# Patient Record
Sex: Male | Born: 1950 | Race: White | Hispanic: No | Smoking: Former smoker
Health system: Southern US, Community
[De-identification: ages and names within clinical notes are randomized; demographics above are authoritative.]

## PROBLEM LIST (undated history)

## (undated) DIAGNOSIS — E039 Hypothyroidism, unspecified: Secondary | ICD-10-CM

## (undated) DIAGNOSIS — C439 Malignant melanoma of skin, unspecified: Secondary | ICD-10-CM

## (undated) DIAGNOSIS — R002 Palpitations: Secondary | ICD-10-CM

## (undated) DIAGNOSIS — R609 Edema, unspecified: Secondary | ICD-10-CM

## (undated) DIAGNOSIS — E785 Hyperlipidemia, unspecified: Secondary | ICD-10-CM

## (undated) DIAGNOSIS — M25569 Pain in unspecified knee: Secondary | ICD-10-CM

## (undated) DIAGNOSIS — M1990 Unspecified osteoarthritis, unspecified site: Secondary | ICD-10-CM

## (undated) DIAGNOSIS — N5082 Scrotal pain: Secondary | ICD-10-CM

## (undated) DIAGNOSIS — H9190 Unspecified hearing loss, unspecified ear: Secondary | ICD-10-CM

## (undated) DIAGNOSIS — N433 Hydrocele, unspecified: Secondary | ICD-10-CM

## (undated) HISTORY — PX: HERNIA REPAIR: SHX51

## (undated) HISTORY — DX: Malignant melanoma of skin, unspecified: C43.9

## (undated) HISTORY — PX: OTHER SURGICAL HISTORY: SHX169

## (undated) HISTORY — DX: Hyperlipidemia, unspecified: E78.5

## (undated) HISTORY — PX: JOINT REPLACEMENT: SHX530

## (undated) HISTORY — DX: Scrotal pain: N50.82

## (undated) HISTORY — DX: Hydrocele, unspecified: N43.3

## (undated) HISTORY — DX: Hypothyroidism, unspecified: E03.9

## (undated) HISTORY — DX: Pain in unspecified knee: M25.569

## (undated) HISTORY — DX: Unspecified osteoarthritis, unspecified site: M19.90

---

## 1995-03-21 HISTORY — PX: TOTAL KNEE ARTHROPLASTY: SHX125

## 2007-06-25 ENCOUNTER — Ambulatory Visit (HOSPITAL_COMMUNITY): Admission: RE | Admit: 2007-06-25 | Discharge: 2007-06-25 | Payer: Self-pay | Admitting: Family Medicine

## 2007-07-09 ENCOUNTER — Ambulatory Visit (HOSPITAL_COMMUNITY): Admission: RE | Admit: 2007-07-09 | Discharge: 2007-07-09 | Payer: Self-pay | Admitting: Internal Medicine

## 2012-03-20 HISTORY — PX: SKIN LESION EXCISION: SHX2412

## 2012-04-26 ENCOUNTER — Ambulatory Visit: Payer: Self-pay | Admitting: Surgery

## 2012-04-26 LAB — CBC WITH DIFFERENTIAL/PLATELET
Basophil #: 0.1 10*3/uL (ref 0.0–0.1)
Basophil %: 0.7 %
Eosinophil %: 1.2 %
Lymphocyte #: 1.7 10*3/uL (ref 1.0–3.6)
MCHC: 35.4 g/dL (ref 32.0–36.0)
Monocyte #: 0.7 x10 3/mm (ref 0.2–1.0)
Monocyte %: 9.2 %
Neutrophil #: 5.5 10*3/uL (ref 1.4–6.5)
Neutrophil %: 68.1 %
RBC: 5.08 10*6/uL (ref 4.40–5.90)

## 2012-04-26 LAB — COMPREHENSIVE METABOLIC PANEL
Albumin: 4.1 g/dL (ref 3.4–5.0)
Anion Gap: 4 — ABNORMAL LOW (ref 7–16)
Calcium, Total: 9.1 mg/dL (ref 8.5–10.1)
Chloride: 107 mmol/L (ref 98–107)
Co2: 30 mmol/L (ref 21–32)
Creatinine: 0.79 mg/dL (ref 0.60–1.30)
EGFR (Non-African Amer.): >60
Glucose: 90 mg/dL (ref 65–99)
Potassium: 3.9 mmol/L (ref 3.5–5.1)
SGOT(AST): 27 U/L (ref 15–37)
SGPT (ALT): 29 U/L (ref 12–78)

## 2012-04-30 ENCOUNTER — Ambulatory Visit: Payer: Self-pay | Admitting: Surgery

## 2012-05-06 LAB — PATHOLOGY REPORT

## 2012-06-12 ENCOUNTER — Ambulatory Visit: Payer: Self-pay | Admitting: Internal Medicine

## 2012-06-14 ENCOUNTER — Ambulatory Visit: Payer: Self-pay | Admitting: Internal Medicine

## 2012-06-18 ENCOUNTER — Ambulatory Visit: Payer: Self-pay | Admitting: Internal Medicine

## 2012-06-20 ENCOUNTER — Ambulatory Visit: Payer: Self-pay | Admitting: Internal Medicine

## 2013-12-17 IMAGING — PT NM PET TUM IMG INITIAL (PI) SKULL BASE T - THIGH
1 of 5 series · 1 of 25 positions shown · non-contrast
Comparison: none

REASON FOR EXAM: pulmonary nodule  melanoma mid back
COMMENTS:

[Series 3: ct legs 3.0 b30f · axial · 3.0mm · 0.98mm/px · 1 of 435 slices shown]
[im 218/435]
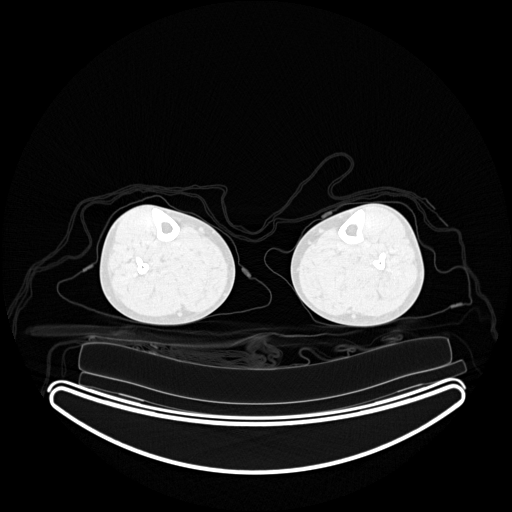

[1 of 25 positions shown; findings below may reference images not displayed]

PROCEDURE:     PET - PET/CT MELANOMA INITIAL STAGE WB  - June 20, 2012  [DATE]

RESULT:

Procedure:  Total body PET was performed in conjunction with nonattenuated
CT.

Radiopharmaceutical: 13.28 mCi of F18 labeled fluorodeoxyglucose

Injection site: Left hand via a 22-gauge needle

Injection time: [DATE] p.m.

Scan start time: [DATE] p.m.

Scan end time: [DATE] p.m.

Delayed imaging was obtained

Scan start time: [DATE] p.m.

Delay end time: [DATE] p.m

Fasting blood glucose is 115 mg/dl
FINDINGS: Expected bio-distribution is appreciated in the region of the brain, heart,
liver, spleen, kidneys, urinary bladder, and bowel.

Chest:  Mild to moderate focus of hypermetabolic activity projects within
the central portion of the mid back in a right paramedial location. There is
mild infiltration of the subcutaneous fat in this area. These findings are
concerning for the patient's region of melanoma. This area demonstrates an
SUV of 1.97.

No further foci of abnormal hypermetabolic activity are identified within
the chest. The nodule described on prior CT demonstrates no evidence of FDG
avidity.

Abdomen and pelvis: There is no evidence of abnormal foci of hypermetabolic
activity.

Bilateral lower extremities: Diffuse hypermetabolic activity is identified
within the musculature of the distal aspects of the right and left legs and
likely representing muscle activity. A hydrocele is appreciated on the right.
IMPRESSION: 1.  Mild to moderate hypermetabolic activity in a region of focal
subcutaneous fat infiltration. This finding is suspicious for the patient's
region of described melanoma or possibly post surgical bed.
2.  Findings consistent with muscle activity within the distal lower
extremities.
3.  No further regions of abnormal hypermetabolic activity appreciated.

## 2014-07-10 NOTE — Op Note (Signed)
PATIENT NAME:  Brian Beck, Brian Beck MR#:  [id number] DATE OF BIRTH:  26-Sep-1950  DATE OF PROCEDURE:  04/30/2012  PREOPERATIVE DIAGNOSIS: Malignant melanoma.   POSTOPERATIVE DIAGNOSIS: Malignant melanoma.   PROCEDURES PERFORMED: 1. Wide excision on right back.  2. Right axillary sentinel lymph node biopsy.   SURGEON: Rodena Goldmann, MD  ASSISTANT: [place], [place] Student  ANESTHESIA: General.   DESCRIPTION OF PROCEDURE: With the patient in the supine position after the induction of appropriate general anesthesia, the patient was placed in the prone position, appropriately padded and positioned. A 1 cm completely elliptical incision was made around the previous lesion using measuring device and markers. The area was removed down to the fascia without difficulty. The specimen was marked and sent to pathology for permanent section. The subcutaneous flaps were mobilized and closed in 2 layers using 3-0 Vicryl as a subcutaneous suture and 3-0 nylon as a skin suture. Sterile dressings were applied. The patient was then returned to the prone position with his arm appropriately padded and positioned. The axillary area was prepped and draped and the area interrogated with the Neoprobe. An incision was made transversely and carried down through the subcutaneous tissue with Bovie electrocautery. Careful dissection of the axilla identified 1 large lymph node which had 300 times the counts of the surrounding tissue. It was removed without difficulty and sent for permanent sections. The remainder of the axilla was examined and no other areas were identified as carrying any radioactivity. Another large lymph node was identified and removed for a second specimen. The area was then irrigated. A drain was placed because of the wide dissection using a 7 mm flat Jackson-Pratt drain. It was secured with 3-0 nylon and the skin was closed with 4-0 nylon. Sterile dressings were applied. The      patient was returned to the recovery room  having tolerated the procedure well. Sponge, instrument and needle counts were correct x 2, in the operating room.  ____________________________ Rodena Goldmann III, MD rle:sb D: 04/30/2012 14:05:09 ET T: 04/30/2012 14:18:37 ET JOB#: [id number]  cc: Rodena Goldmann III, MD, <Dictator> Rodena Goldmann MD ELECTRONICALLY SIGNED 05/01/2012 18:05

## 2014-11-13 ENCOUNTER — Other Ambulatory Visit: Payer: Self-pay | Admitting: Family Medicine

## 2014-11-17 ENCOUNTER — Telehealth: Payer: Self-pay | Admitting: *Deleted

## 2014-11-17 ENCOUNTER — Encounter: Payer: Self-pay | Admitting: *Deleted

## 2014-11-17 NOTE — Telephone Encounter (Signed)
4 attempts made to reach pt; with busy/invalid signal each time.Brian KitchenMarland KitchenWill attempt to contact via letter.Brian KitchenMarland Beck

## 2015-01-27 ENCOUNTER — Encounter: Payer: Self-pay | Admitting: Urology

## 2015-01-28 ENCOUNTER — Ambulatory Visit (INDEPENDENT_AMBULATORY_CARE_PROVIDER_SITE_OTHER): Payer: BLUE CROSS/BLUE SHIELD | Admitting: Urology

## 2015-01-28 ENCOUNTER — Encounter: Payer: Self-pay | Admitting: Urology

## 2015-01-28 VITALS — BP 144/79 | HR 89 | Ht 64.0 in | Wt 195.4 lb

## 2015-01-28 DIAGNOSIS — N433 Hydrocele, unspecified: Secondary | ICD-10-CM

## 2015-01-28 DIAGNOSIS — K4021 Bilateral inguinal hernia, without obstruction or gangrene, recurrent: Secondary | ICD-10-CM

## 2015-01-28 NOTE — Progress Notes (Signed)
01/28/2015 3:18 PM   Brian Beck 10-Jan-1951 [id number]  Referring provider: No referring provider defined for this encounter.  Chief Complaint  Patient presents with  . Hydrocele    HPI: 64 year old male with known right hydrocele, bilateral inguinal hernia who presents today for reassessment. He was last seen in our office in December 2015 at which time he was diagnosed with a large right hydrocele. He also has a known history of bilateral asymptomatic inguinal hernias but elected not to have these treated at the time. He did have a scrotal ultrasound on 03/03/2014 which showed normal testicles bilaterally, large right hydrocele, and bilateral inguinal fat-containing hernias.  He returns today as his right hemiscrotum continues to slowly enlarge.  It has become more symptomatic and bothersome and he now with like to proceed with surgical intervention for this.  He does complain of discomfort in the groin area, heaviness, and difficulty fitting his pants in the crotch area.  Denies any bowel symptoms associated with his inguinal hernias. No urinary issues including no dysuria, hematuria, infections, or urgency/ frequency.     PMH: Past Medical History  Diagnosis Date  . Melanoma of skin ([place])   . Arthritis   . Pain in scrotum   . Pain in knee   . Hypothyroidism   . Hypertension   . Hyperlipemia   . Hydrocele, right     Surgical History: Past Surgical History  Procedure Laterality Date  . Total knee arthroplasty Right 1997  . Skin lesion excision  2014    Home Medications:    Medication List       This list is accurate as of: 01/28/15  3:18 PM.  Always use your most recent med list.               hydrochlorothiazide 25 MG tablet  Commonly known as:  HYDRODIURIL        Allergies:  Allergies  Allergen Reactions  . Penicillins Swelling    Family History: Family History  Problem Relation Age of Onset  . Heart disease Father   . Kidney disease Mother    . Prostate cancer Neg Hx   . Bladder Cancer Neg Hx     Social History:  reports that he has been smoking Cigarettes.  He has been smoking about 0.75 packs per day. His smokeless tobacco use includes Snuff. He reports that he drinks alcohol. He reports that he does not use illicit drugs.  ROS: UROLOGY Frequent Urination?: No Hard to postpone urination?: No Burning/pain with urination?: No Get up at night to urinate?: No Leakage of urine?: No Urine stream starts and stops?: No Trouble starting stream?: No Do you have to strain to urinate?: No Blood in urine?: No Urinary tract infection?: No Sexually transmitted disease?: No Injury to kidneys or bladder?: No Painful intercourse?: No Weak stream?: Yes Erection problems?: No Penile pain?: No  Gastrointestinal Nausea?: No Vomiting?: No Indigestion/heartburn?: No Diarrhea?: No Constipation?: No  Constitutional Fever: No Night sweats?: No Weight loss?: No Fatigue?: No  Skin Skin rash/lesions?: No Itching?: No  Eyes Blurred vision?: No Double vision?: No  Ears/Nose/Throat Sore throat?: No Sinus problems?: No  Hematologic/Lymphatic Swollen glands?: No Easy bruising?: No  Cardiovascular Leg swelling?: No Chest pain?: No  Respiratory Cough?: No Shortness of breath?: No  Endocrine Excessive thirst?: No  Musculoskeletal Back pain?: No Joint pain?: Yes  Neurological Headaches?: No Dizziness?: No  Psychologic Depression?: No Anxiety?: No  Physical Exam: BP 144/79 mmHg  Pulse 89  Ht 5\' 4"  (1.626 m)  Wt 195 lb 6.4 oz (88.633 kg)  BMI 33.52 kg/m2  Constitutional:  Alert and oriented, No acute distress. HEENT: [place] AT, moist mucus membranes.  Trachea midline, no masses. Cardiovascular: No clubbing, cyanosis, or edema.   RRR. Respiratory: Normal respiratory effort, no increased work of breathing.  CTAB. GI: Abdomen is soft, nontender, nondistended, no abdominal masses GU: No CVA tenderness. Normal  phallus. Enlarged fluctuant right hemiscrotum, approximately small honeydew melon sized. Bilateral inguinal hernias not obviously appreciated but exam limited by habitus.  Skin: No rashes, bruises or suspicious lesions. Lymph: No cervical or inguinal adenopathy. Neurologic: Grossly intact, no focal deficits, moving all 4 extremities. Psychiatric: Normal mood and affect.  Laboratory Data: Lab Results  Component Value Date   WBC 8.0 04/26/2012   HGB 16.3 04/26/2012   HCT 45.9 04/26/2012   MCV 90 04/26/2012   PLT 253 04/26/2012    Lab Results  Component Value Date   CREATININE 0.79 04/26/2012     Pertinent Imaging: Scrotal US 03/02/14 EXAM: US SCROTUM DATE: 03/02/14 12:09:26 ACCESSION: [id number] UN DICTATED: 03/02/14 14:22:17 INTERPRETATION LOCATION: [place]  CLINICAL INDICATION: 64 Year Old (M): Right scrotal enlargement  COMPARISON: None  TECHNIQUE:  Ultrasound views of the scrotum were obtained using gray scale and color and spectral Doppler imaging.  FINDINGS: The testes measured 4.8 x 3.2 x 3.6 cm on the right and 4.7 x 2.5 x 3.2 cm on the left.  Appropriate internal flow was demonstrated with color Doppler.  The echogenicity of the testicles was within normal limits. No focal testicular masses were seen.  The right epididymal head measured 11.9 mm in size with the left 8.5 mm in size.  Appropriate flow was seen.  A large hydrocele was noted on the right that contained simple fluid. No hydrocele was seen on the left.  A small amount of fat is seen extending into the proximal inguinal canals bilaterally with Valsalva.   IMPRESSION: -- Large right hydrocele. -- Small, fat-containing inguinal hernias bilaterally.   Assessment & Plan:    1. Hydrocele, right Discussed various options for treatment of his right hydrocele including right hydrocelectomy versus percutaneous drainage. Given the presence of an inguinal hernia on the right in addition to the hydrocele,  would recommend concomitant surgery with repair of the hydrocele and hernia in order to further reduce recurrence in the future. We discussed the overall efficacy rate as well as the risks of surgery including risk of bleeding, hematoma, infection, damage to surrounding structures, testicular loss, recurrence rates, as well as the common postoperative course. All of his questions were answered in detail today.  Plan refer the patient to Gen. surgery to assess bilateral inguinal hernias and will attempt to coordinate surgery in the near future.  - Ambulatory referral to General Surgery   2. Bilateral recurrent inguinal hernia without obstruction or gangrene As above - Ambulatory referral to General Surgery    Return schedule surgery.   Hollice Espy, MD  [facility] [address] [place], [place] [address] [phone]

## 2015-02-26 ENCOUNTER — Encounter: Payer: Self-pay | Admitting: General Surgery

## 2015-02-26 ENCOUNTER — Ambulatory Visit (INDEPENDENT_AMBULATORY_CARE_PROVIDER_SITE_OTHER): Payer: BLUE CROSS/BLUE SHIELD | Admitting: General Surgery

## 2015-02-26 ENCOUNTER — Encounter (INDEPENDENT_AMBULATORY_CARE_PROVIDER_SITE_OTHER): Payer: Self-pay

## 2015-02-26 VITALS — BP 149/83 | HR 76 | Temp 98.4°F | Ht 65.0 in | Wt 197.0 lb

## 2015-02-26 DIAGNOSIS — K4020 Bilateral inguinal hernia, without obstruction or gangrene, not specified as recurrent: Secondary | ICD-10-CM | POA: Insufficient documentation

## 2015-02-26 NOTE — Progress Notes (Signed)
Patient ID: Brian Beck, male   DOB: 1950-04-29, 64 y.o.   MRN: [id number]  CC: Hernia  HPI Brian Beck is a 64 y.o. male sent here for evaluation of bilateral inguinal hernias. He is also had a urologic evaluation for a large right-sided hydrocele. Urology since him for evaluation for a concomitant hydrocele repair and hernia repair. Patient states that he is known about his bilateral anal hernias for a long time however only his right side of her bothersome. He occasionally has pain up into his right abdomen down his right leg associated with activity such as lifting. It always goes away with rest and lying back. Difficult to ascertain the difference between bulge and groin and hydrocele. Both are large. He is otherwise in his usual state of health. He denies any fevers, chills, nausea, vomiting, diarrhea, constipation. He does have an allergy to penicillins.  HPI  Past Medical History  Diagnosis Date  . Melanoma of skin ([place])   . Arthritis   . Pain in scrotum   . Pain in knee   . Hypothyroidism   . Hypertension   . Hyperlipemia   . Hydrocele, right     Past Surgical History  Procedure Laterality Date  . Total knee arthroplasty Right 1997  . Skin lesion excision  2014    Family History  Problem Relation Age of Onset  . Heart disease Father   . Kidney disease Mother   . Prostate cancer Neg Hx   . Bladder Cancer Neg Hx     Social History Social History  Substance Use Topics  . Smoking status: Current Every Day Smoker -- 0.75 packs/day    Types: Cigarettes  . Smokeless tobacco: Current User    Types: Snuff  . Alcohol Use: 0.0 oz/week    0 Standard drinks or equivalent per week    Allergies  Allergen Reactions  . Penicillins Swelling    Current Outpatient Prescriptions  Medication Sig Dispense Refill  . Cyanocobalamin (VITAMIN B-12 IJ) Inject 1,000 mcg as directed once a week.    . hydrochlorothiazide (HYDRODIURIL) 25 MG tablet   0  .  multivitamin-iron-minerals-folic acid (CENTRUM) chewable tablet Chew 1 tablet by mouth daily.    . Omega-3 Fatty Acids (FISH OIL) 1000 MG CAPS Take 1 capsule by mouth 1 day or 1 dose.     No current facility-administered medications for this visit.     Review of Systems A multi-point review of systems was asked and was negative except for the findings documented in the history of present illness  Physical Exam Blood pressure 149/83, pulse 76, temperature 98.4 F (36.9 C), temperature source Oral, height 5\' 5"  (1.651 m), weight 89.359 kg (197 lb). CONSTITUTIONAL: No acute distress. EYES: Pupils are equal, round, and reactive to light, Sclera are non-icteric. EARS, NOSE, MOUTH AND THROAT: The oropharynx is clear. The oral mucosa is pink and moist. Hearing is intact to voice. LYMPH NODES:  Lymph nodes in the neck are normal. RESPIRATORY:  Lungs are clear. There is normal respiratory effort, with equal breath sounds bilaterally, and without pathologic use of accessory muscles. CARDIOVASCULAR: Heart is regular without murmurs, gallops, or rubs. GI: The abdomen is large, soft, nontender, and nondistended. There are no palpable masses. There is no hepatosplenomegaly. There are normal bowel sounds in all quadrants. GU: Very large right-sided hydrocele appreciated. Otherwise normal-appearing left hemiscrotum and penis. Easily palpable and reducible right inguinal hernia. Nontender on exam. Smaller but palpable and reducible left anal hernia. Also  nontender. Patient does have abdominal girth that overhangs like a pannus..   MUSCULOSKELETAL: Normal muscle strength and tone. No cyanosis or edema.   SKIN: Turgor is good and there are no pathologic skin lesions or ulcers. NEUROLOGIC: Motor and sensation is grossly normal. Cranial nerves are grossly intact. PSYCH:  Oriented to person, place and time. Affect is normal.  Data Reviewed No new labs her images. I have personally reviewed the patient's  imaging, laboratory findings and medical records.    Assessment    Right-sided hydrocele and bilateral inguinal hernias.    Plan    Discussed with patient that it would be possible for concomitant surgery for his right-sided hydrocele and inguinal hernia repair. Discussed that given his left side is primarily asymptomatic in that his hydrocele surgery will be on the right that we would plan to do an open right inguinal hernia. Same time as hydrocele repair. Patient voiced understanding and is appreciative of the ability to do concomitant surgeries. Discussed with urology with a plan for surgery on December 27.  I have explained the procedure, risks, and aftercare of inguinal hernia repair to [employer].   Risks include but are not limited to bleeding, infection, wound problems, anesthesia, recurrence, bladder or intestine injury, urinary retention, testicular dysfunction, chronic pain, mesh problems.  He  seems to understand and agrees to proceed.  Questions were answered to his stated satisfaction.      Time spent with the patient was 45 minutes, with more than 50% of the time spent in face-to-face education, counseling and care coordination.     Clayburn Pert, MD FACS General Surgeon 02/26/2015, 2:30 PM

## 2015-02-26 NOTE — Patient Instructions (Signed)
As soon as we get your surgery arranged with Dr. Erlene Quan and we will call you with the details of this.

## 2015-03-01 ENCOUNTER — Telehealth: Payer: Self-pay | Admitting: General Surgery

## 2015-03-01 NOTE — Telephone Encounter (Signed)
I have called Pt to advised him of pre op date/time and sx date. No answer. I have left a detailed message with information below.  Sx: 03/16/15 with Dr Golden Circle Right inguinal hernia repair with Dr Erlene Quan (Urology) performing Hydrocele repair.  Pre op: 03/04/15 @ 1:00pm. Office.

## 2015-03-03 NOTE — Telephone Encounter (Signed)
Patient has called back and was informed of surgery appointment. Patient agrees.

## 2015-03-04 ENCOUNTER — Ambulatory Visit
Admission: RE | Admit: 2015-03-04 | Discharge: 2015-03-04 | Disposition: A | Discharge status: Home / Self Care | Payer: BLUE CROSS/BLUE SHIELD | Source: Ambulatory Visit | Attending: General Surgery | Admitting: General Surgery

## 2015-03-04 ENCOUNTER — Encounter
Admission: RE | Admit: 2015-03-04 | Discharge: 2015-03-04 | Disposition: A | Discharge status: Home / Self Care | Payer: BLUE CROSS/BLUE SHIELD | Source: Ambulatory Visit | Attending: General Surgery | Admitting: General Surgery

## 2015-03-04 ENCOUNTER — Other Ambulatory Visit: Payer: Self-pay

## 2015-03-04 DIAGNOSIS — I700 Atherosclerosis of aorta: Secondary | ICD-10-CM | POA: Insufficient documentation

## 2015-03-04 DIAGNOSIS — Z01818 Encounter for other preprocedural examination: Secondary | ICD-10-CM | POA: Insufficient documentation

## 2015-03-04 DIAGNOSIS — Z01810 Encounter for preprocedural cardiovascular examination: Secondary | ICD-10-CM

## 2015-03-04 LAB — COMPREHENSIVE METABOLIC PANEL
ALBUMIN: 4 g/dL (ref 3.5–5.0)
ALK PHOS: 47 U/L (ref 38–126)
ALT: 20 U/L (ref 17–63)
AST: 17 U/L (ref 15–41)
Anion gap: 9 (ref 5–15)
BILIRUBIN TOTAL: 0.8 mg/dL (ref 0.3–1.2)
BUN: 18 mg/dL (ref 6–20)
CALCIUM: 9.1 mg/dL (ref 8.9–10.3)
CO2: 29 mmol/L (ref 22–32)
CREATININE: 0.78 mg/dL (ref 0.61–1.24)
Chloride: 104 mmol/L (ref 101–111)
GFR calc Af Amer: >60 mL/min (ref >60)
GLUCOSE: 94 mg/dL (ref 65–99)
Potassium: 3.4 mmol/L — ABNORMAL LOW (ref 3.5–5.1)
SODIUM: 142 mmol/L (ref 135–145)
TOTAL PROTEIN: 6.7 g/dL (ref 6.5–8.1)

## 2015-03-04 LAB — CBC WITH DIFFERENTIAL/PLATELET
BASOS ABS: 0 10*3/uL (ref 0–0.1)
BASOS PCT: 1 %
EOS ABS: 0.1 10*3/uL (ref 0–0.7)
EOS PCT: 2 %
HCT: 42.9 % (ref 40.0–52.0)
Hemoglobin: 15 g/dL (ref 13.0–18.0)
LYMPHS PCT: 16 %
Lymphs Abs: 1.3 10*3/uL (ref 1.0–3.6)
MCH: 31.3 pg (ref 26.0–34.0)
MCHC: 35 g/dL (ref 32.0–36.0)
MCV: 89.4 fL (ref 80.0–100.0)
Monocytes Absolute: 0.7 10*3/uL (ref 0.2–1.0)
Monocytes Relative: 9 %
Neutro Abs: 6 10*3/uL (ref 1.4–6.5)
Neutrophils Relative %: 74 %
PLATELETS: 230 10*3/uL (ref 150–440)
RBC: 4.8 MIL/uL (ref 4.40–5.90)
RDW: 13.6 % (ref 11.5–14.5)
WBC: 8.2 10*3/uL (ref 3.8–10.6)

## 2015-03-04 LAB — PROTIME-INR
INR: 0.96
Prothrombin Time: 13 seconds (ref 11.4–15.0)

## 2015-03-04 LAB — APTT: APTT: 46 s — AB (ref 24–36)

## 2015-03-04 NOTE — OR Nursing (Signed)
Notified Angie at [facility] Surgical patient has abnormal ECG and will need clearance

## 2015-03-04 NOTE — Patient Instructions (Signed)
  Your procedure is scheduled on: Tuesday 03/16/2015 Report to Day Surgery. 2ND FLOOR MEDICAL MALL ENTRANCE To find out your arrival time please call [phone] between 1PM - 3PM on Friday 03/12/2015.  Remember: Instructions that are not followed completely may result in serious medical risk, up to and including death, or upon the discretion of your surgeon and anesthesiologist your surgery may need to be rescheduled.    __X__ 1. Do not eat food or drink liquids after midnight. No gum chewing or hard candies.     __X__ 2. No Alcohol for 24 hours before or after surgery.   ____ 3. Bring all medications with you on the day of surgery if instructed.    __X__ 4. Notify your doctor if there is any change in your medical condition     (cold, fever, infections).     Do not wear jewelry, make-up, hairpins, clips or nail polish.  Do not wear lotions, powders, or perfumes. You may wear deodorant.  Do not shave 48 hours prior to surgery. Men may shave face and neck.  Do not bring valuables to the hospital.    [facility] is not responsible for any belongings or valuables.               Contacts, dentures or bridgework may not be worn into surgery.  Leave your suitcase in the car. After surgery it may be brought to your room.  For patients admitted to the hospital, discharge time is determined by your                treatment team.   Patients discharged the day of surgery will not be allowed to drive home.   Please read over the following fact sheets that you were given:   Surgical Site Infection Prevention   ___ Take these medicines the morning of surgery with A SIP OF WATER:    1. NONE  2.   3.   4.  5.  6.  ____ Fleet Enema (as directed)   __X__ Use CHG Soap as directed  ____ Use inhalers on the day of surgery  ____ Stop metformin 2 days prior to surgery    ____ Take 1/2 of usual insulin dose the night before surgery and none on the morning of surgery.   ____ Stop  Coumadin/Plavix/aspirin on   ____ Stop Anti-inflammatories on    __X__ Stop supplements until after surgery.  FISH OIL, VITAMIN C, VITAMIN B12  ____ Bring C-Pap to the hospital.

## 2015-03-05 NOTE — OR Nursing (Signed)
Abnormal PTT value results faxed to Dr. Adonis Huguenin and Dr. Cherrie Gauze offices.

## 2015-03-08 ENCOUNTER — Telehealth: Payer: Self-pay | Admitting: General Surgery

## 2015-03-08 NOTE — Telephone Encounter (Signed)
Amy from Urology called and stated that they received lab results on the patient with an elevated aPTT. Please review pre op labs and if needed, contact patient with APPROPRIATE  ANTICOAGULANT THERAPY.

## 2015-03-08 NOTE — Telephone Encounter (Signed)
I have reviewed PTT results.  Patient is not currently taking any anticoagulant therapy. Verified this information with patient. Dr. Adonis Huguenin received same results. No new orders at this time.

## 2015-03-11 NOTE — Pre-Procedure Instructions (Signed)
Left message at Dr Rosario Jacks office re status of clearance. Was for stress test 03/09/15

## 2015-03-12 ENCOUNTER — Telehealth: Payer: Self-pay

## 2015-03-12 NOTE — Telephone Encounter (Signed)
Received call from [facility] in Pre-admit to ask about status of clearance for scheduled 12/27 surgery for which I was unaware of. Upon looking at notes in chart, Dr. Rosario Jacks was to see patient for clearance and stress test on 12/20.   Spoke with staff at Dr. Guerry Bruin office at this time in regards to patient's surgical clearance, she will fax over notes from 12/20 visit at this time.

## 2015-03-12 NOTE — Telephone Encounter (Signed)
Clearance obtained from Dr. Rosario Jacks, placed in chart under media.  Call to PAT at this time, spoke with [facility]. She will leave a message for Baker Janus to let her know this information.

## 2015-03-16 ENCOUNTER — Encounter
Admission: RE | Disposition: A | Discharge status: Home / Self Care | Payer: Self-pay | Source: Ambulatory Visit | Attending: Urology

## 2015-03-16 ENCOUNTER — Ambulatory Visit
Admission: RE | Admit: 2015-03-16 | Discharge: 2015-03-16 | Disposition: A | Discharge status: Home / Self Care | Payer: BLUE CROSS/BLUE SHIELD | Source: Ambulatory Visit | Attending: Urology | Admitting: Urology

## 2015-03-16 ENCOUNTER — Ambulatory Visit: Payer: BLUE CROSS/BLUE SHIELD | Admitting: Anesthesiology

## 2015-03-16 DIAGNOSIS — E039 Hypothyroidism, unspecified: Secondary | ICD-10-CM | POA: Diagnosis not present

## 2015-03-16 DIAGNOSIS — N433 Hydrocele, unspecified: Secondary | ICD-10-CM | POA: Insufficient documentation

## 2015-03-16 DIAGNOSIS — Z880 Allergy status to penicillin: Secondary | ICD-10-CM | POA: Insufficient documentation

## 2015-03-16 DIAGNOSIS — Z79899 Other long term (current) drug therapy: Secondary | ICD-10-CM | POA: Insufficient documentation

## 2015-03-16 DIAGNOSIS — K4020 Bilateral inguinal hernia, without obstruction or gangrene, not specified as recurrent: Secondary | ICD-10-CM | POA: Diagnosis not present

## 2015-03-16 DIAGNOSIS — Z96651 Presence of right artificial knee joint: Secondary | ICD-10-CM | POA: Diagnosis not present

## 2015-03-16 DIAGNOSIS — Z841 Family history of disorders of kidney and ureter: Secondary | ICD-10-CM | POA: Diagnosis not present

## 2015-03-16 DIAGNOSIS — K4090 Unilateral inguinal hernia, without obstruction or gangrene, not specified as recurrent: Secondary | ICD-10-CM | POA: Diagnosis present

## 2015-03-16 DIAGNOSIS — E785 Hyperlipidemia, unspecified: Secondary | ICD-10-CM | POA: Insufficient documentation

## 2015-03-16 DIAGNOSIS — Z85820 Personal history of malignant melanoma of skin: Secondary | ICD-10-CM | POA: Diagnosis not present

## 2015-03-16 DIAGNOSIS — M1990 Unspecified osteoarthritis, unspecified site: Secondary | ICD-10-CM | POA: Diagnosis not present

## 2015-03-16 DIAGNOSIS — K469 Unspecified abdominal hernia without obstruction or gangrene: Secondary | ICD-10-CM | POA: Insufficient documentation

## 2015-03-16 DIAGNOSIS — I10 Essential (primary) hypertension: Secondary | ICD-10-CM | POA: Diagnosis not present

## 2015-03-16 DIAGNOSIS — Z8249 Family history of ischemic heart disease and other diseases of the circulatory system: Secondary | ICD-10-CM | POA: Diagnosis not present

## 2015-03-16 DIAGNOSIS — F17210 Nicotine dependence, cigarettes, uncomplicated: Secondary | ICD-10-CM | POA: Diagnosis not present

## 2015-03-16 HISTORY — PX: INGUINAL HERNIA REPAIR: SHX194

## 2015-03-16 HISTORY — PX: HYDROCELE EXCISION: SHX482

## 2015-03-16 SURGERY — HYDROCELECTOMY
Anesthesia: General | Laterality: Right | Wound class: Clean Contaminated

## 2015-03-16 MED ORDER — MIDAZOLAM HCL 2 MG/2ML IJ SOLN
INTRAMUSCULAR | Status: DC | PRN
Start: 2015-03-16 — End: 2015-03-16
  Administered 2015-03-16: 2 mg via INTRAVENOUS

## 2015-03-16 MED ORDER — ONDANSETRON HCL 4 MG/2ML IJ SOLN
INTRAMUSCULAR | Status: DC | PRN
  Administered 2015-03-16: 4 mg via INTRAVENOUS

## 2015-03-16 MED ORDER — CLINDAMYCIN PHOSPHATE 900 MG/50ML IV SOLN
INTRAVENOUS | Status: AC
  Filled 2015-03-16: qty 50

## 2015-03-16 MED ORDER — FAMOTIDINE 20 MG PO TABS
20.0000 mg | ORAL_TABLET | Freq: Once | ORAL | Status: AC
  Administered 2015-03-16: 20 mg via ORAL

## 2015-03-16 MED ORDER — LIDOCAINE HCL 1 % IJ SOLN
INTRAMUSCULAR | Status: DC | PRN
  Administered 2015-03-16: 10 mL

## 2015-03-16 MED ORDER — FAMOTIDINE 20 MG PO TABS
ORAL_TABLET | ORAL | Status: AC
  Administered 2015-03-16: 20 mg via ORAL
  Filled 2015-03-16: qty 1

## 2015-03-16 MED ORDER — CHLORHEXIDINE GLUCONATE 4 % EX LIQD: 1.0000 | Freq: Once | CUTANEOUS | Status: DC

## 2015-03-16 MED ORDER — DOCUSATE SODIUM 100 MG PO CAPS: 100.0000 mg | ORAL_CAPSULE | Freq: Two times a day (BID) | ORAL | Status: AC

## 2015-03-16 MED ORDER — ONDANSETRON HCL 4 MG/2ML IJ SOLN: 4.0000 mg | Freq: Once | INTRAMUSCULAR | Status: DC | PRN

## 2015-03-16 MED ORDER — SODIUM CHLORIDE 0.9 % IJ SOLN
INTRAMUSCULAR | Status: AC
  Filled 2015-03-16: qty 50

## 2015-03-16 MED ORDER — GLYCOPYRROLATE 0.2 MG/ML IJ SOLN
INTRAMUSCULAR | Status: DC | PRN
  Administered 2015-03-16: 0.4 mg via INTRAVENOUS

## 2015-03-16 MED ORDER — CLINDAMYCIN PHOSPHATE 900 MG/50ML IV SOLN
900.0000 mg | INTRAVENOUS | Status: AC
  Administered 2015-03-16: 900 mg via INTRAVENOUS

## 2015-03-16 MED ORDER — ACETAMINOPHEN 10 MG/ML IV SOLN
INTRAVENOUS | Status: AC
  Filled 2015-03-16: qty 100

## 2015-03-16 MED ORDER — BUPIVACAINE LIPOSOME 1.3 % IJ SUSP
INTRAMUSCULAR | Status: AC
  Filled 2015-03-16: qty 20

## 2015-03-16 MED ORDER — BUPIVACAINE HCL (PF) 0.5 % IJ SOLN
INTRAMUSCULAR | Status: AC
  Filled 2015-03-16: qty 30

## 2015-03-16 MED ORDER — ROCURONIUM BROMIDE 100 MG/10ML IV SOLN
INTRAVENOUS | Status: DC | PRN
  Administered 2015-03-16: 40 mg via INTRAVENOUS

## 2015-03-16 MED ORDER — FENTANYL CITRATE (PF) 100 MCG/2ML IJ SOLN: 25.0000 ug | INTRAMUSCULAR | Status: DC | PRN

## 2015-03-16 MED ORDER — SODIUM CHLORIDE 0.9 % IV SOLN
INTRAVENOUS | Status: DC | PRN
  Administered 2015-03-16: 40 mL

## 2015-03-16 MED ORDER — LIDOCAINE HCL (PF) 1 % IJ SOLN
INTRAMUSCULAR | Status: AC
  Filled 2015-03-16: qty 30

## 2015-03-16 MED ORDER — PROPOFOL 10 MG/ML IV BOLUS
INTRAVENOUS | Status: DC | PRN
  Administered 2015-03-16: 180 mg via INTRAVENOUS

## 2015-03-16 MED ORDER — LACTATED RINGERS IV SOLN
INTRAVENOUS | Status: DC
  Administered 2015-03-16: 12:00:00 via INTRAVENOUS

## 2015-03-16 MED ORDER — OXYCODONE-ACETAMINOPHEN 5-325 MG PO TABS
1.0000 | ORAL_TABLET | ORAL | Status: DC | PRN
  Administered 2015-03-16: 1 via ORAL

## 2015-03-16 MED ORDER — OXYCODONE-ACETAMINOPHEN 5-325 MG PO TABS: 1.0000 | ORAL_TABLET | ORAL | Status: DC | PRN

## 2015-03-16 MED ORDER — FENTANYL CITRATE (PF) 100 MCG/2ML IJ SOLN
INTRAMUSCULAR | Status: DC | PRN
  Administered 2015-03-16: 50 ug via INTRAVENOUS
  Administered 2015-03-16: 100 ug via INTRAVENOUS
  Administered 2015-03-16: 50 ug via INTRAVENOUS

## 2015-03-16 MED ORDER — ONDANSETRON 4 MG PO TBDP: 4.0000 mg | ORAL_TABLET | Freq: Three times a day (TID) | ORAL | Status: DC | PRN

## 2015-03-16 MED ORDER — ACETAMINOPHEN 10 MG/ML IV SOLN
INTRAVENOUS | Status: DC | PRN
  Administered 2015-03-16: 1000 mg via INTRAVENOUS

## 2015-03-16 MED ORDER — NEOSTIGMINE METHYLSULFATE 10 MG/10ML IV SOLN
INTRAVENOUS | Status: DC | PRN
  Administered 2015-03-16: 3 mg via INTRAVENOUS

## 2015-03-16 MED ORDER — LIDOCAINE HCL (CARDIAC) 20 MG/ML IV SOLN
INTRAVENOUS | Status: DC | PRN
  Administered 2015-03-16: 100 mg via INTRAVENOUS
  Administered 2015-03-16: 40 mg via INTRAVENOUS

## 2015-03-16 MED ORDER — OXYCODONE-ACETAMINOPHEN 5-325 MG PO TABS
ORAL_TABLET | ORAL | Status: AC
  Filled 2015-03-16: qty 1

## 2015-03-16 SURGICAL SUPPLY — 48 items
BLADE SURG 15 STRL LF DISP TIS (BLADE) ×2 IMPLANT
BLADE SURG 15 STRL SS (BLADE) ×4
CANISTER SUCT 1200ML W/VALVE (MISCELLANEOUS) ×3 IMPLANT
CHLORAPREP W/TINT 26ML (MISCELLANEOUS) ×3 IMPLANT
DRAIN PENROSE 1/4X12 LTX (DRAIN) ×3 IMPLANT
DRAIN PENROSE 5/8X18 LTX STRL (WOUND CARE) ×3 IMPLANT
DRAPE LAPAROTOMY 100X77 ABD (DRAPES) ×3 IMPLANT
DRAPE LAPAROTOMY 77X122 PED (DRAPES) ×3 IMPLANT
DRAPE SHEET LG 3/4 BI-LAMINATE (DRAPES) IMPLANT
ELECT CAUTERY BLADE 6.4 (BLADE) ×3 IMPLANT
GAUZE FLUFF 18X24 1PLY STRL (GAUZE/BANDAGES/DRESSINGS) ×3 IMPLANT
GAUZE SPONGE 4X4 12PLY STRL (GAUZE/BANDAGES/DRESSINGS) ×3 IMPLANT
GAUZE STRETCH 2X75IN STRL (MISCELLANEOUS) ×3 IMPLANT
GLOVE BIO SURGEON STRL SZ 6.5 (GLOVE) ×4 IMPLANT
GLOVE BIO SURGEON STRL SZ7 (GLOVE) ×6 IMPLANT
GLOVE BIO SURGEON STRL SZ7.5 (GLOVE) ×3 IMPLANT
GLOVE BIO SURGEONS STRL SZ 6.5 (GLOVE) ×2
GLOVE INDICATOR 8.0 STRL GRN (GLOVE) ×3 IMPLANT
GOWN STRL REUS W/ TWL LRG LVL3 (GOWN DISPOSABLE) ×3 IMPLANT
GOWN STRL REUS W/TWL LRG LVL3 (GOWN DISPOSABLE) ×6
KIT RM TURNOVER STRD PROC AR (KITS) ×3 IMPLANT
LABEL OR SOLS (LABEL) ×6 IMPLANT
LIQUID BAND (GAUZE/BANDAGES/DRESSINGS) ×3 IMPLANT
MESH PARIETEX PROGRIP RIGHT (Mesh General) ×3 IMPLANT
NDL SAFETY 25GX1.5 (NEEDLE) ×3 IMPLANT
NEEDLE HYPO 25X1 1.5 SAFETY (NEEDLE) ×3 IMPLANT
NS IRRIG 500ML POUR BTL (IV SOLUTION) ×3 IMPLANT
PACK BASIN MINOR ARMC (MISCELLANEOUS) ×6 IMPLANT
PAD GROUND ADULT SPLIT (MISCELLANEOUS) ×3 IMPLANT
PREP PVP WINGED SPONGE (MISCELLANEOUS) IMPLANT
SOL PREP PVP 2OZ (MISCELLANEOUS)
SOLUTION PREP PVP 2OZ (MISCELLANEOUS) IMPLANT
SUPPORETR ATHLETIC LG (MISCELLANEOUS) ×1 IMPLANT
SUPPORTER ATHLETIC LG (MISCELLANEOUS) ×3
SUT ETHIBOND 0 MO6 C/R (SUTURE) IMPLANT
SUT ETHILON 3-0 FS-10 30 BLK (SUTURE) ×3
SUT MNCRL 4-0 (SUTURE) ×2
SUT MNCRL 4-0 27XMFL (SUTURE) ×1
SUT SILK 2 0 SH (SUTURE) IMPLANT
SUT VIC AB 2-0 CT2 27 (SUTURE) ×6 IMPLANT
SUT VIC AB 3-0 SH 27 (SUTURE) ×6
SUT VIC AB 3-0 SH 27X BRD (SUTURE) ×3 IMPLANT
SUT VIC AB 4-0 SH 27 (SUTURE) ×2
SUT VIC AB 4-0 SH 27XANBCTRL (SUTURE) ×1 IMPLANT
SUTURE EHLN 3-0 FS-10 30 BLK (SUTURE) ×1 IMPLANT
SUTURE MNCRL 4-0 27XMF (SUTURE) ×1 IMPLANT
SYR 50ML LL SCALE MARK (SYRINGE) ×3 IMPLANT
SYRINGE 10CC LL (SYRINGE) ×9 IMPLANT

## 2015-03-16 NOTE — Interval H&P Note (Signed)
History and Physical Interval Note:  03/16/2015 12:03 PM  Brian Beck  has presented today for surgery, with the diagnosis of right hydrocele  The various methods of treatment have been discussed with the patient and family. After consideration of risks, benefits and other options for treatment, the patient has consented to  Procedure(s): HYDROCELECTOMY ADULT (Right) HERNIA REPAIR INGUINAL ADULT (Bilateral) as a surgical intervention .  The patient's history has been reviewed, patient examined, no change in status, stable for surgery.  I have reviewed the patient's chart and labs.  Questions were answered to the patient's satisfaction.     Clayburn Pert

## 2015-03-16 NOTE — Transfer of Care (Signed)
Immediate Anesthesia Transfer of Care Note  Patient: Brian Beck  Procedure(s) Performed: Procedure(s): HYDROCELECTOMY ADULT (Right) HERNIA REPAIR INGUINAL ADULT WITH MESH (Right)  Patient Location: PACU  Anesthesia Type:General  Level of Consciousness: sedated  Airway & Oxygen Therapy: Patient Spontanous Breathing and Patient connected to face mask oxygen  Post-op Assessment: Report given to RN and Post -op Vital signs reviewed and stable  Post vital signs: Reviewed and stable  Last Vitals:  Filed Vitals:   03/16/15 1127 03/16/15 1451  BP: 141/63 153/93  Pulse: 71 85  Temp: 36.7 C 36.5 C  Resp: 14 17    Complications: No apparent anesthesia complications

## 2015-03-16 NOTE — H&P (View-Only) (Signed)
Patient ID: Brian Beck, male   DOB: Aug 31, 1950, 64 y.o.   MRN: [id number]  CC: Hernia  HPI Brian Beck is a 64 y.o. male sent here for evaluation of bilateral inguinal hernias. He is also had a urologic evaluation for a large right-sided hydrocele. Urology since him for evaluation for a concomitant hydrocele repair and hernia repair. Patient states that he is known about his bilateral anal hernias for a long time however only his right side of her bothersome. He occasionally has pain up into his right abdomen down his right leg associated with activity such as lifting. It always goes away with rest and lying back. Difficult to ascertain the difference between bulge and groin and hydrocele. Both are large. He is otherwise in his usual state of health. He denies any fevers, chills, nausea, vomiting, diarrhea, constipation. He does have an allergy to penicillins.  HPI  Past Medical History  Diagnosis Date  . Melanoma of skin ([place])   . Arthritis   . Pain in scrotum   . Pain in knee   . Hypothyroidism   . Hypertension   . Hyperlipemia   . Hydrocele, right     Past Surgical History  Procedure Laterality Date  . Total knee arthroplasty Right 1997  . Skin lesion excision  2014    Family History  Problem Relation Age of Onset  . Heart disease Father   . Kidney disease Mother   . Prostate cancer Neg Hx   . Bladder Cancer Neg Hx     Social History Social History  Substance Use Topics  . Smoking status: Current Every Day Smoker -- 0.75 packs/day    Types: Cigarettes  . Smokeless tobacco: Current User    Types: Snuff  . Alcohol Use: 0.0 oz/week    0 Standard drinks or equivalent per week    Allergies  Allergen Reactions  . Penicillins Swelling    Current Outpatient Prescriptions  Medication Sig Dispense Refill  . Cyanocobalamin (VITAMIN B-12 IJ) Inject 1,000 mcg as directed once a week.    . hydrochlorothiazide (HYDRODIURIL) 25 MG tablet   0  .  multivitamin-iron-minerals-folic acid (CENTRUM) chewable tablet Chew 1 tablet by mouth daily.    . Omega-3 Fatty Acids (FISH OIL) 1000 MG CAPS Take 1 capsule by mouth 1 day or 1 dose.     No current facility-administered medications for this visit.     Review of Systems A multi-point review of systems was asked and was negative except for the findings documented in the history of present illness  Physical Exam Blood pressure 149/83, pulse 76, temperature 98.4 F (36.9 C), temperature source Oral, height 5\' 5"  (1.651 m), weight 89.359 kg (197 lb). CONSTITUTIONAL: No acute distress. EYES: Pupils are equal, round, and reactive to light, Sclera are non-icteric. EARS, NOSE, MOUTH AND THROAT: The oropharynx is clear. The oral mucosa is pink and moist. Hearing is intact to voice. LYMPH NODES:  Lymph nodes in the neck are normal. RESPIRATORY:  Lungs are clear. There is normal respiratory effort, with equal breath sounds bilaterally, and without pathologic use of accessory muscles. CARDIOVASCULAR: Heart is regular without murmurs, gallops, or rubs. GI: The abdomen is large, soft, nontender, and nondistended. There are no palpable masses. There is no hepatosplenomegaly. There are normal bowel sounds in all quadrants. GU: Very large right-sided hydrocele appreciated. Otherwise normal-appearing left hemiscrotum and penis. Easily palpable and reducible right inguinal hernia. Nontender on exam. Smaller but palpable and reducible left anal hernia. Also  nontender. Patient does have abdominal girth that overhangs like a pannus..   MUSCULOSKELETAL: Normal muscle strength and tone. No cyanosis or edema.   SKIN: Turgor is good and there are no pathologic skin lesions or ulcers. NEUROLOGIC: Motor and sensation is grossly normal. Cranial nerves are grossly intact. PSYCH:  Oriented to person, place and time. Affect is normal.  Data Reviewed No new labs her images. I have personally reviewed the patient's  imaging, laboratory findings and medical records.    Assessment    Right-sided hydrocele and bilateral inguinal hernias.    Plan    Discussed with patient that it would be possible for concomitant surgery for his right-sided hydrocele and inguinal hernia repair. Discussed that given his left side is primarily asymptomatic in that his hydrocele surgery will be on the right that we would plan to do an open right inguinal hernia. Same time as hydrocele repair. Patient voiced understanding and is appreciative of the ability to do concomitant surgeries. Discussed with urology with a plan for surgery on December 27.  I have explained the procedure, risks, and aftercare of inguinal hernia repair to [employer].   Risks include but are not limited to bleeding, infection, wound problems, anesthesia, recurrence, bladder or intestine injury, urinary retention, testicular dysfunction, chronic pain, mesh problems.  He  seems to understand and agrees to proceed.  Questions were answered to his stated satisfaction.      Time spent with the patient was 45 minutes, with more than 50% of the time spent in face-to-face education, counseling and care coordination.     Clayburn Pert, MD FACS General Surgeon 02/26/2015, 2:30 PM

## 2015-03-16 NOTE — Anesthesia Preprocedure Evaluation (Addendum)
Anesthesia Evaluation  Patient identified by MRN, date of birth, ID band Patient awake    Reviewed: Allergy & Precautions, NPO status , Patient's Chart, lab work & pertinent test results, reviewed documented beta blocker date and time   Airway Mallampati: II  TM Distance: >3 FB     Dental  (+) Chipped, Partial Lower   Pulmonary Current Smoker,           Cardiovascular hypertension, Pt. on medications      Neuro/Psych    GI/Hepatic   Endo/Other    Renal/GU      Musculoskeletal  (+) Arthritis ,   Abdominal   Peds  Hematology   Anesthesia Other Findings EKG ok. Poor R wave progression. No cardiac symptoms. Active man. IRBBB.  Reproductive/Obstetrics                            Anesthesia Physical Anesthesia Plan  ASA: II  Anesthesia Plan: General   Post-op Pain Management:    Induction: Intravenous  Airway Management Planned: Oral ETT and LMA  Additional Equipment:   Intra-op Plan:   Post-operative Plan:   Informed Consent: I have reviewed the patients History and Physical, chart, labs and discussed the procedure including the risks, benefits and alternatives for the proposed anesthesia with the patient or authorized representative who has indicated his/her understanding and acceptance.     Plan Discussed with: CRNA  Anesthesia Plan Comments:         Anesthesia Quick Evaluation

## 2015-03-16 NOTE — Op Note (Signed)
  03/16/2015  2:37 PM  PATIENT:  Brian Beck  64 y.o. male  PRE-OPERATIVE DIAGNOSIS:  Right inguinal hernia  POST-OPERATIVE DIAGNOSIS:  Indirect right inguinal hernia  PROCEDURE:  Right inguinal hernia repair  SURGEON:  Surgeon(s) and Role: Panel 1:    * Hollice Espy, MD - Primary  Panel 2:    * Clayburn Pert, MD - Primary  ASSISTANTS: None  ANESTHESIA: Gen.  INDICATIONS FOR PROCEDURE right groin pain  DICTATION: 64 year old male with a combined procedure for a right hydrocele and right inguinal hernia. Right hydrocele dictation to be done by Dr. Erlene Quan as a separate operative note.  At the conclusion of the hydrocele repair we proceeded with an open right inguinal hernia repair. The external oblique fascia was cleared off until the external opening of the inguinal canal could be identified. The fascia was opened sharply in the direction of fibers along visualization of the entire spermatic cord that was isolated and elevated with a Penrose drain. A large indirect internal hernia was identified once the cremasteric fibers were opened. This was easily dissected clear from its attachments to the spermatic cord and reduced with ease. A single 2-0 Ethibond stitch was used to close off the opening and maintain reduction of the hernia. A parietal progrip Right-sided mesh was brought to the field and cut to the appropriate size and placed around the spermatic cord down onto the pelvic floor. The mesh was sewn in place in 2 places with 2-0 Vicryl sutures.  The entire area was copiously irrigated with normal saline and meticulous hemostasis was insured. The entire operative field was then localized with Exparel. The external oblique fascia was then closed with a running 2-0 Vicryl suture. The deep dermal tissues reapproximated with interrupted 3-0 Vicryl suture. The skin was closed with a running subcutaneous 4-0 Monocryl. The skin was then sealed with LiquiBand.  All counts were correct  at the end of the procedure and there were no immediate, complications. The patient was awoken from general anesthesia and transferred to the PACU in stable condition.  Findings: Indirect right inguinal hernia  EBL: 20 mL's  Complications: None   Clayburn Pert, MD

## 2015-03-16 NOTE — Discharge Instructions (Signed)
Hydrocelectomy, Care After Refer to this sheet in the next few weeks. These instructions provide you with information about caring for yourself after your procedure. Your health care provider may also give you more specific instructions. Your treatment has been planned according to current medical practices, but problems sometimes occur. Call your health care provider if you have any problems or questions after your procedure. WHAT TO EXPECT AFTER THE PROCEDURE After your procedure, it is common for the pouch that holds your testicles (scrotum) to be painful, swollen, and bruised. HOME CARE INSTRUCTIONS Bathing  Ask your health care provider when you can shower, take baths, or go swimming.  If you were told to wear an athletic support strap, take it off when you shower or take a bath. Incision Care  Follow instructions from your health care provider about how to take care of your incision. Make sure you:  Wash your hands with soap and water before you change your bandage (dressing). If soap and water are not available, use hand sanitizer.  Change your dressing as told by your health care provider.  Leave stitches (sutures) in place.  Check your incision and scrotum every day for signs of infection. Check for:  More redness, swelling, or pain.  Blood or fluid.  Warmth.  Pus or a bad smell. Managing Pain, Stiffness, and Swelling  If directed, apply ice to the injured area:  Put ice in a plastic bag.  Place a towel between your skin and the bag.  Leave the ice on for 20 minutes, 2-3 times per day. Driving  Do not drive for 24 hours if you received a sedative.  Do not drive or operate heavy machinery while taking prescription pain medicine.  Ask your health care provider when it is safe to drive. Activity  Do not do any activities that require great strength and energy (are vigorous) for as long as told by your health care provider.  Return to your normal activities as  told by your health care provider. Ask your health care provider what activities are safe for you.  Do not lift anything that is heavier than 10 lb (4.5 kg) until your health care provider says that it is safe. General Instructions  Take over-the-counter and prescription medicines only as told by your health care provider.  Keep all follow-up visits as told by your health care provider. This is important.  If you were given an athletic support strap, wear it as told by your health care provider.  If you had a drain put in during the procedure, you will need to return to have it removed. SEEK MEDICAL CARE IF:  Your pain gets worse.  You have more redness, swelling, or pain around your scrotum.  You have blood or fluid coming from your scrotum.  Your incision feels warm to the touch.  You have pus or a bad smell coming from your scrotum.  You have a fever.   This information is not intended to replace advice given to you by your health care provider. Make sure you discuss any questions you have with your health care provider.   Document Released: 11/25/2014 Document Reviewed: 09/02/2014 Elsevier Interactive Patient Education 2016 [place]:  Please make an appointment with your physician in 2 week(s).  Call your physician immediately if you have any fevers greater than 102.5, drainage from you wound that is not clear or looks infected, persistent bleeding, increasing abdominal pain, problems urinating, or persistent nausea/vomiting.  WOUND CARE INSTRUCTIONS:  Keep a dry clean dressing on the wound if there is drainage. The initial bandage may be removed after 24 hours.  Once the wound has quit draining you may leave it open to air.  If clothing rubs against the wound or causes irritation and the wound is not draining you may cover it with a dry dressing during the daytime.  Try to keep the wound dry and avoid ointments on the wound unless  directed to do so.  If the wound becomes bright red and painful or starts to drain infected material that is not clear, please contact your physician immediately.  If the wound is mildly pink and has a thick firm ridge underneath it, this is normal, and is referred to as a healing ridge.  This will resolve over the next 4-6 weeks.  DIET:  You may eat any foods that you can tolerate.  It is a good idea to eat a high fiber diet and take in plenty of fluids to prevent constipation.  If you do become constipated you may want to take a mild laxative or take ducolax tablets on a daily basis until your bowel habits are regular.  Constipation can be very uncomfortable, along with straining, after recent abdominal surgery.  ACTIVITY:  You are encouraged to cough and deep breath or use your incentive spirometer if you were given one, every 15-30 minutes when awake.  This will help prevent respiratory complications and low grade fevers post-operatively.  You may want to hug a pillow when coughing and sneezing to add additional support to the surgical area which will decrease pain during these times.  You are encouraged to walk and engage in light activity for the next two weeks.  You should not lift more than 20 pounds during this time frame as it could put you at increased risk for a hernia recurrence.  Twenty pounds is roughly equivalent to a plastic bag of groceries.    MEDICATIONS:  Try to take narcotic medications and anti-inflammatory medications, such as tylenol, ibuprofen, naprosyn, etc., with food.  This will minimize stomach upset from the medication.  Should you develop nausea and vomiting from the pain medication, or develop a rash, please discontinue the medication and contact your physician.  You should not drive, make important decisions, or operate machinery when taking narcotic pain medication.  QUESTIONS:  Please feel free to call your physician or the hospital operator if you have any questions, and  they will be glad to assist you.  AMBULATORY SURGERY  DISCHARGE INSTRUCTIONS   1) The drugs that you were given will stay in your system until tomorrow so for the next 24 hours you should not:  A) Drive an automobile B) Make any legal decisions C) Drink any alcoholic beverage   2) You may resume regular meals tomorrow.  Today it is better to start with liquids and gradually work up to solid foods.  You may eat anything you prefer, but it is better to start with liquids, then soup and crackers, and gradually work up to solid foods.   3) Please notify your doctor immediately if you have any unusual bleeding, trouble breathing, redness and pain at the surgery site, drainage, fever, or pain not relieved by medication.    4) Additional Instructions:        Please contact your physician with any problems or Same Day Surgery at [phone], Monday through Friday 6 am to 4 pm, or [place] at [facility]  Main number at [phone].

## 2015-03-16 NOTE — Brief Op Note (Signed)
03/16/2015  2:35 PM  PATIENT:  Brian Beck  64 y.o. male  PRE-OPERATIVE DIAGNOSIS:  right hydrocele right inguinal hernia  POST-OPERATIVE DIAGNOSIS:  right hydrocele right inguinal hernia  PROCEDURE:  Procedure(s): HYDROCELECTOMY ADULT (Right) HERNIA REPAIR INGUINAL ADULT WITH MESH (Right)  SURGEON:  Surgeon(s) and Role: Panel 1:    * Hollice Espy, MD - Primary  Panel 2:    * Clayburn Pert, MD - Primary  PHYSICIAN ASSISTANT:   ASSISTANTS: none   ANESTHESIA:   general  EBL:  Total I/O In: 750 [I.V.:750] Out: 20 [Blood:20]  BLOOD ADMINISTERED:none  DRAINS: Penrose drain in the scrotum   LOCAL MEDICATIONS USED:  XYLOCAINE , Amount: 10 ml and OTHER exparel 83ml  SPECIMEN:  No Specimen  DISPOSITION OF SPECIMEN:  N/A  COUNTS:  YES  TOURNIQUET:  * No tourniquets in log *  DICTATION: .Dragon Dictation  PLAN OF CARE: Discharge to home after PACU  PATIENT DISPOSITION:  PACU - hemodynamically stable.   Delay start of Pharmacological VTE agent (>24hrs) due to surgical blood loss or risk of bleeding: not applicable

## 2015-03-16 NOTE — Anesthesia Procedure Notes (Signed)
Procedure Name: Intubation Date/Time: 03/16/2015 1:15 PM Performed by: Rosaria Ferries, Shafter Jupin Pre-anesthesia Checklist: Patient identified, Emergency Drugs available, Suction available, Patient being monitored and Timeout performed Patient Re-evaluated:Patient Re-evaluated prior to inductionOxygen Delivery Method: Circle system utilized Preoxygenation: Pre-oxygenation with 100% oxygen Intubation Type: IV induction Laryngoscope Size: Mac and 4 Grade View: Grade II Tube type: Oral Tube size: 7.5 mm Number of attempts: 2 Secured at: 20 cm Tube secured with: Tape Dental Injury: Teeth and Oropharynx as per pre-operative assessment

## 2015-03-16 NOTE — Op Note (Signed)
Date of procedure: 03/16/2015  Preoperative diagnosis:  1. Right hydrocele 2. Right inguinal hernia   Postoperative diagnosis:  1. Right hydrocele 2. Right inguinal hernia   Procedure: 1. Right hydrocelectomy (inguinal approach)  Surgeon: Hollice Espy, MD  Assistant: Clayburn Pert, MD (please see additional dictation for hernia repair)  Anesthesia: General  Complications: None  Intraoperative findings: 400 cc of serous fluid drained from hydrocele  EBL: Minimal  Specimens: Hydrocele sac  Drains: Penrose drain  Indication: Brian Beck is a 64 y.o. patient with large right hydrocele along with the incidental right inguinal hernia.  After reviewing the management options for treatment, he elected to proceed with the above surgical procedure(s). We have discussed the potential benefits and risks of the procedure, side effects of the proposed treatment, the likelihood of the patient achieving the goals of the procedure, and any potential problems that might occur during the procedure or recuperation. Informed consent has been obtained.  Description of procedure:  The patient was taken to the operating room and general anesthesia was induced.  The patient was placed in the dorsal lithotomy position, prepped and draped in the usual sterile fashion, and preoperative antibiotics were administered. A preoperative time-out was performed.   A right proximally 15 mm inguinal incision was created transversely after 10 cc of local anesthetic was injected. The incision was carried down to the level of the fascia. The external ring was visualized along with the cord and cord structures. The cord was isolated with a Penrose drain. Blunt dissection was then performed to free up the right testicle and hydrocele sac from the scrotal tissue. Once this was adequately mobilized, it was able to be delivered through the inguinal incision. The hydrocele sac was then incised and clear straw-colored  fluid was evacuated, proximal and 400 cc. The edges of the hydrocele sac were then excised using Bovie electrocautery. The edges of the sac were oversewn using a running 3-0 Vicryl suture. Hemostasis was carefully achieved using Bovie electrocautery. The scrotum was everted through the incision and careful hemostasis of the dartos tissue was obtained using Bovie electrocautery as well. A small stab incision was created within the deep ended portion of the scrotum and a small Penrose drain was placed which was secured in place using a nylon suture. The right testicle was then placed back into the right dependent scrotum in its correct anatomic position with care to ensure that the lateral sulcus remained lateral. The remainder of the procedure was then performed by Dr. Adonis Huguenin of general surgery, please see his dictation for additional details  Plan: Patient will return in 2 days for Penrose drain removal.   Hollice Espy, M.D.

## 2015-03-16 NOTE — Interval H&P Note (Signed)
History and Physical Interval Note:  03/16/2015 12:49 PM  Brian Beck  has presented today for surgery, with the diagnosis of right hydrocele  The various methods of treatment have been discussed with the patient and family. After consideration of risks, benefits and other options for treatment, the patient has consented to  Procedure(s): HYDROCELECTOMY ADULT (Right) HERNIA REPAIR INGUINAL ADULT (Bilateral) as a surgical intervention .  The patient's history has been reviewed, patient examined, no change in status, stable for surgery.  I have reviewed the patient's chart and labs.  Questions were answered to the patient's satisfaction.     Hollice Espy

## 2015-03-17 ENCOUNTER — Encounter: Payer: Self-pay | Admitting: Urology

## 2015-03-17 NOTE — Anesthesia Postprocedure Evaluation (Signed)
Anesthesia Post Note  Patient: Brian Beck  Procedure(s) Performed: Procedure(s) (LRB): HYDROCELECTOMY ADULT (Right) HERNIA REPAIR INGUINAL ADULT WITH MESH (Right)  Patient location during evaluation: PACU Anesthesia Type: General Level of consciousness: awake Pain management: satisfactory to patient Vital Signs Assessment: post-procedure vital signs reviewed and stable Respiratory status: nonlabored ventilation Cardiovascular status: stable Anesthetic complications: no    Last Vitals:  Filed Vitals:   03/16/15 1550 03/16/15 1608  BP: 108/65 102/64  Pulse: 58   Temp:    Resp: 16     Last Pain:  Filed Vitals:   03/17/15 0816  PainSc: 1                  VAN STAVEREN,Kolston Lacount

## 2015-03-18 ENCOUNTER — Ambulatory Visit: Payer: BLUE CROSS/BLUE SHIELD | Admitting: Urology

## 2015-03-18 LAB — SURGICAL PATHOLOGY

## 2015-03-19 ENCOUNTER — Encounter: Payer: Self-pay | Admitting: Urology

## 2015-03-19 ENCOUNTER — Ambulatory Visit (INDEPENDENT_AMBULATORY_CARE_PROVIDER_SITE_OTHER): Payer: BLUE CROSS/BLUE SHIELD | Admitting: Urology

## 2015-03-19 VITALS — BP 107/69 | HR 77 | Ht 64.0 in | Wt 197.0 lb

## 2015-03-19 DIAGNOSIS — K4021 Bilateral inguinal hernia, without obstruction or gangrene, recurrent: Secondary | ICD-10-CM

## 2015-03-19 DIAGNOSIS — N433 Hydrocele, unspecified: Secondary | ICD-10-CM

## 2015-03-19 NOTE — Progress Notes (Signed)
8:49 AM  03/19/2015   Brian Beck 13-Jan-1951 [id number]  Referring provider: Casilda Carls, MD [address]   [place], [place] [address]  Chief Complaint  Patient presents with  . Routine Post Op    drain removal    HPI: 64 year old male with known right hydrocele, bilateral inguinal hernia s/p R inguinal hernia/hydrocelectomy 3 days ago. He returns to the office today for drain removal. He has leaked a bit of blood through the drain has been changing the dressing as needed. No severe pain. No bright red area around the wound or drain. No fevers or chills. No pus.   PMH: Past Medical History  Diagnosis Date  . Melanoma of skin ([place])   . Arthritis   . Pain in scrotum   . Pain in knee   . Hypothyroidism   . Hypertension   . Hyperlipemia   . Hydrocele, right     Surgical History: Past Surgical History  Procedure Laterality Date  . Total knee arthroplasty Right 1997  . Skin lesion excision  2014  . Hydrocele excision Right 03/16/2015    Procedure: HYDROCELECTOMY ADULT;  Surgeon: Hollice Espy, MD;  Location: [facility] ORS;  Service: Urology;  Laterality: Right;  . Inguinal hernia repair Right 03/16/2015    Procedure: HERNIA REPAIR INGUINAL ADULT WITH MESH;  Surgeon: Clayburn Pert, MD;  Location: [facility] ORS;  Service: General;  Laterality: Right;    Home Medications:    Medication List       This list is accurate as of: 03/19/15  8:49 AM.  Always use your most recent med list.               ascorbic acid 1000 MG tablet  Commonly known as:  VITAMIN C  Take 1,000 mg by mouth daily.     CENTRUM SILVER PO  Take 1 tablet by mouth daily. Reported on 03/19/2015     docusate sodium 100 MG capsule  Commonly known as:  COLACE  Take 1 capsule (100 mg total) by mouth 2 (two) times daily.     Fish Oil 1000 MG Caps  Take 1 capsule by mouth 1 day or 1 dose.     hydrochlorothiazide 25 MG tablet  Commonly known as:  HYDRODIURIL  Take 25 mg by mouth daily. Reported  on 03/19/2015     oxyCODONE-acetaminophen 5-325 MG tablet  Commonly known as:  ROXICET  Take 1-2 tablets by mouth every 4 (four) hours as needed for moderate pain or severe pain.     vitamin B-12 500 MCG tablet  Commonly known as:  CYANOCOBALAMIN  Take 500 mcg by mouth daily.        Allergies:  Allergies  Allergen Reactions  . Penicillins Swelling    Family History: Family History  Problem Relation Age of Onset  . Heart disease Father   . Kidney disease Mother   . Prostate cancer Neg Hx   . Bladder Cancer Neg Hx     Social History:  reports that he has been smoking Cigarettes.  He has been smoking about 0.75 packs per day. His smokeless tobacco use includes Snuff. He reports that he drinks alcohol. He reports that he does not use illicit drugs.  ROS: UROLOGY Frequent Urination?: No Hard to postpone urination?: No Burning/pain with urination?: No Get up at night to urinate?: No Leakage of urine?: No Urine stream starts and stops?: No Trouble starting stream?: No Do you have to strain to urinate?: No Blood in urine?:  No Urinary tract infection?: No Sexually transmitted disease?: No Injury to kidneys or bladder?: No Painful intercourse?: No Weak stream?: No Erection problems?: No Penile pain?: No  Gastrointestinal Nausea?: No Vomiting?: No Indigestion/heartburn?: No Diarrhea?: No Constipation?: No  Constitutional Fever: No Night sweats?: No Weight loss?: No Fatigue?: No  Skin Skin rash/lesions?: No Itching?: No  Eyes Blurred vision?: No Double vision?: No  Ears/Nose/Throat Sore throat?: No Sinus problems?: No  Hematologic/Lymphatic Swollen glands?: No Easy bruising?: No  Cardiovascular Leg swelling?: No Chest pain?: No  Respiratory Cough?: No Shortness of breath?: No  Endocrine Excessive thirst?: No  Musculoskeletal Back pain?: No Joint pain?: Yes  Neurological Headaches?: No Dizziness?: No  Psychologic Depression?:  No Anxiety?: No  Physical Exam: BP 107/69 mmHg  Pulse 77  Ht 5\' 4"  (1.626 m)  Wt 197 lb (89.359 kg)  BMI 33.80 kg/m2  Constitutional:  Alert and oriented, No acute distress. HEENT: [place] AT, moist mucus membranes.  Trachea midline, no masses. Cardiovascular: No clubbing, cyanosis, or edema.   RRR. Respiratory: Normal respiratory effort, no increased work of breathing.  CTAB. GI: Abdomen is soft, nontender, nondistended, no abdominal masses GU: No CVA tenderness. Buried phallus. Right inguinal incision clean dry and intact with Dermabond in place. Scrotum mildly edematous with mild ecchymosis. Dependent drain in place. Drain removed today without complication. Skin: No rashes, bruises or suspicious lesions. Neurologic: Grossly intact, no focal deficits, moving all 4 extremities. Psychiatric: Normal mood and affect.  Laboratory Data: Lab Results  Component Value Date   WBC 8.2 03/04/2015   HGB 15.0 03/04/2015   HCT 42.9 03/04/2015   MCV 89.4 03/04/2015   PLT 230 03/04/2015    Lab Results  Component Value Date   CREATININE 0.78 03/04/2015     Pertinent Imaging: Scrotal US 03/02/14 EXAM: US SCROTUM DATE: 03/02/14 12:09:26 ACCESSION: [id number] UN DICTATED: 03/02/14 14:22:17 INTERPRETATION LOCATION: [place]  CLINICAL INDICATION: 64 Year Old (M): Right scrotal enlargement  COMPARISON: None  TECHNIQUE:  Ultrasound views of the scrotum were obtained using gray scale and color and spectral Doppler imaging.  FINDINGS: The testes measured 4.8 x 3.2 x 3.6 cm on the right and 4.7 x 2.5 x 3.2 cm on the left.  Appropriate internal flow was demonstrated with color Doppler.  The echogenicity of the testicles was within normal limits. No focal testicular masses were seen.  The right epididymal head measured 11.9 mm in size with the left 8.5 mm in size.  Appropriate flow was seen.  A large hydrocele was noted on the right that contained simple fluid. No hydrocele was seen on the  left.  A small amount of fat is seen extending into the proximal inguinal canals bilaterally with Valsalva.   IMPRESSION: -- Large right hydrocele. -- Small, fat-containing inguinal hernias bilaterally.   Assessment & Plan:    1. Hydrocele, right S/p drain removal today No evidence of infection F/u in 4 weeks for wound check   2. Bilateral inguinal hernia S/p RIH repair   Return in about 4 weeks (around 04/16/2015) for wound check.   Hollice Espy, MD  [facility] [address] [place], [place] [address] [phone]

## 2015-04-01 ENCOUNTER — Encounter: Payer: Self-pay | Admitting: General Surgery

## 2015-04-01 ENCOUNTER — Ambulatory Visit (INDEPENDENT_AMBULATORY_CARE_PROVIDER_SITE_OTHER): Payer: BLUE CROSS/BLUE SHIELD | Admitting: General Surgery

## 2015-04-01 VITALS — BP 149/85 | HR 77 | Temp 98.3°F | Wt 192.0 lb

## 2015-04-01 DIAGNOSIS — Z4889 Encounter for other specified surgical aftercare: Secondary | ICD-10-CM

## 2015-04-01 NOTE — Patient Instructions (Signed)
Please give [place] a call if you notice any redness, fever, and/or drainage. If you notice any bulge, please give [place] a call. Remember that this will take about six months for it to be normal.

## 2015-04-01 NOTE — Progress Notes (Signed)
Outpatient Surgical Follow Up  04/01/2015  Brian Beck is an 65 y.o. male.   Chief Complaint  Patient presents with  . Routine Post Op    Inguinal Hernia Repair Dr. Adonis Huguenin 03/16/2015    HPI:  65 year old male returns to clinic 2 weeks status post open right inguinal hernia repair. Patient reports no pain currently. Very happy with his surgical results. Surgery was done in combination with urology for repair of a hydrocele and he's been following up with urology as well. Patient denies any fevers, chills, nausea, vomiting, diarrhea, constipation.  Past Medical History  Diagnosis Date  . Melanoma of skin ([place])   . Arthritis   . Pain in scrotum   . Pain in knee   . Hypothyroidism   . Hypertension   . Hyperlipemia   . Hydrocele, right     Past Surgical History  Procedure Laterality Date  . Total knee arthroplasty Right 1997  . Skin lesion excision  2014  . Hydrocele excision Right 03/16/2015    Procedure: HYDROCELECTOMY ADULT;  Surgeon: Hollice Espy, MD;  Location: [facility] ORS;  Service: Urology;  Laterality: Right;  . Inguinal hernia repair Right 03/16/2015    Procedure: HERNIA REPAIR INGUINAL ADULT WITH MESH;  Surgeon: Clayburn Pert, MD;  Location: [facility] ORS;  Service: General;  Laterality: Right;    Family History  Problem Relation Age of Onset  . Heart disease Father   . Kidney disease Mother   . Prostate cancer Neg Hx   . Bladder Cancer Neg Hx     Social History:  reports that he has been smoking Cigarettes.  He has been smoking about 0.75 packs per day. His smokeless tobacco use includes Snuff. He reports that he drinks alcohol. He reports that he does not use illicit drugs.  Allergies:  Allergies  Allergen Reactions  . Penicillins Swelling    Medications reviewed.    ROS  a multipoint review of systems was completed. All pertinent positives and negatives in the history of present illness and remainder negative.   BP 149/85 mmHg  Pulse 77  Temp(Src)  98.3 F (36.8 C) (Oral)  Wt 87.091 kg (192 lb)  Physical Exam  Gen.: No acute distress Chest: Clear to auscultation Heart: Regular rate and rhythm Abdomen: Soft, nontender, nondistended. Well approximated open right anal hernia incision with LiquiBand in place. No evidence of erythema or drainage. No evidence of right-sided hernia recurrence on exam.    No results found for this or any previous visit (from the past 48 hour(s)). No results found.  Assessment/Plan:  1. Aftercare following surgery  65 year old male 2 weeks status post open right anal hernia repair in combination with urology repair of right-sided hydrocele. Patient doing very well. Discussed the signs and symptoms of infection or recurrence and return to clinic medially should they occur. Otherwise discussed appropriate timeframe for returning to normal activities being 6 weeks after surgery. Patient voiced understanding and will follow up on an as-needed basis.     Clayburn Pert, MD FACS General Surgeon  04/01/2015,9:20 AM

## 2015-04-05 ENCOUNTER — Telehealth: Payer: Self-pay

## 2015-04-05 NOTE — Telephone Encounter (Signed)
FMLA Form was faxed to patient's disability company ([employer]). Patient to return to work on 04/26/2015. Patient is aware of his return to work date and agreed with Dr. Adonis Huguenin.

## 2015-04-20 ENCOUNTER — Encounter: Payer: Self-pay | Admitting: Urology

## 2015-04-20 ENCOUNTER — Ambulatory Visit (INDEPENDENT_AMBULATORY_CARE_PROVIDER_SITE_OTHER): Payer: BLUE CROSS/BLUE SHIELD | Admitting: Urology

## 2015-04-20 VITALS — BP 154/89 | HR 88 | Ht 64.0 in | Wt 199.8 lb

## 2015-04-20 DIAGNOSIS — K4021 Bilateral inguinal hernia, without obstruction or gangrene, recurrent: Secondary | ICD-10-CM

## 2015-04-20 DIAGNOSIS — N433 Hydrocele, unspecified: Secondary | ICD-10-CM

## 2015-04-20 NOTE — Progress Notes (Signed)
8:22 PM  04/20/2015   Brian Beck 12-15-1950 [id number]  Referring provider: Casilda Carls, MD [address]   [place], [place] [address]  Chief Complaint  Patient presents with  . Hydrocele    4wk    HPI: 65 year old male with known right hydrocele, bilateral inguinal hernia s/p R inguinal hernia/hydrocelectomy on 03/16/2015.  His postoperative course has been uncomplicated. He presents today for routine postop visit.  Today, he has no complaints. He notes that his scrotum has decreased significantly in size. He is now able to wear pants and cross his legs. He does have some residual firmness in his right hemiscrotum and over his right inguinal area which is somewhat concerning to him. No pain. No redness or drainage.  PMH: Past Medical History  Diagnosis Date  . Melanoma of skin ([place])   . Arthritis   . Pain in scrotum   . Pain in knee   . Hypothyroidism   . Hypertension   . Hyperlipemia   . Hydrocele, right     Surgical History: Past Surgical History  Procedure Laterality Date  . Total knee arthroplasty Right 1997  . Skin lesion excision  2014  . Hydrocele excision Right 03/16/2015    Procedure: HYDROCELECTOMY ADULT;  Surgeon: Hollice Espy, MD;  Location: [facility] OR[facility];  Service: Urology;  Laterality: Right;  . Inguinal hernia repair Right 03/16/2015    Procedure: HERNIA REPAIR INGUINAL ADULT WITH MESH;  Surgeon: Clayburn Pert, MD;  Location: [facility] ORS;  Service: General;  Laterality: Right;    Home Medications:    Medication List       This list is accurate as of: 04/20/15  8:22 PM.  Always use your most recent med list.               ascorbic acid 1000 MG tablet  Commonly known as:  VITAMIN C  Take 1,000 mg by mouth daily.     CENTRUM SILVER PO  Take 1 tablet by mouth daily. Reported on 03/19/2015     docusate sodium 100 MG capsule  Commonly known as:  COLACE  Take 1 capsule (100 mg total) by mouth 2 (two) times daily.     Fish Oil 1000 MG Caps   Take 1 capsule by mouth 1 day or 1 dose.     hydrochlorothiazide 25 MG tablet  Commonly known as:  HYDRODIURIL  Take 25 mg by mouth daily. Reported on 03/19/2015     vitamin B-12 500 MCG tablet  Commonly known as:  CYANOCOBALAMIN  Take 500 mcg by mouth daily.        Allergies:  Allergies  Allergen Reactions  . Penicillins Swelling    Family History: Family History  Problem Relation Age of Onset  . Heart disease Father   . Kidney disease Mother   . Prostate cancer Neg Hx   . Bladder Cancer Neg Hx     Social History:  reports that he has been smoking Cigarettes.  He has been smoking about 0.75 packs per day. His smokeless tobacco use includes Snuff. He reports that he drinks alcohol. He reports that he does not use illicit drugs.  ROS: UROLOGY Frequent Urination?: No Hard to postpone urination?: No Burning/pain with urination?: No Get up at night to urinate?: No Leakage of urine?: No Urine stream starts and stops?: No Trouble starting stream?: No Do you have to strain to urinate?: No Blood in urine?: No Urinary tract infection?: No Sexually transmitted disease?: No Injury to kidneys  or bladder?: No Painful intercourse?: No Weak stream?: No Erection problems?: No Penile pain?: No  Gastrointestinal Nausea?: No Vomiting?: No Indigestion/heartburn?: No Diarrhea?: No Constipation?: No  Constitutional Fever: No Night sweats?: No Weight loss?: No Fatigue?: No  Skin Skin rash/lesions?: No Itching?: No  Eyes Blurred vision?: No Double vision?: No  Ears/Nose/Throat Sore throat?: No Sinus problems?: No  Hematologic/Lymphatic Swollen glands?: No Easy bruising?: No  Cardiovascular Leg swelling?: No Chest pain?: No  Respiratory Cough?: No Shortness of breath?: No  Endocrine Excessive thirst?: No  Musculoskeletal Back pain?: No Joint pain?: No  Neurological Headaches?: No Dizziness?: No  Psychologic Depression?: No Anxiety?:  No  Physical Exam: BP 154/89 mmHg  Pulse 88  Ht 5\' 4"  (1.626 m)  Wt 199 lb 12.8 oz (90.629 kg)  BMI 34.28 kg/m2  Constitutional:  Alert and oriented, No acute distress. HEENT: [place] AT, moist mucus membranes.  Trachea midline, no masses. Cardiovascular: No clubbing, cyanosis, or edema.   RRR. Respiratory: Normal respiratory effort, no increased work of breathing.  CTAB. GI: Abdomen is soft, nontender, nondistended, no abdominal masses GU: Normal scrotal size. Left testicle normal without masses. Right scrotum with mildly enlarged right testicle and cord, somewhat firm but nontender. No erythema or drainage. Skin: No rashes, bruises or suspicious lesions. Neurologic: Grossly intact, no focal deficits, moving all 4 extremities. Psychiatric: Normal mood and affect.  Laboratory Data: Lab Results  Component Value Date   WBC 8.2 03/04/2015   HGB 15.0 03/04/2015   HCT 42.9 03/04/2015   MCV 89.4 03/04/2015   PLT 230 03/04/2015    Lab Results  Component Value Date   CREATININE 0.78 03/04/2015    Assessment & Plan:    1. Hydrocele, right Mild residual right hemiscrotal enlargement and firmness along the cord consistent with small postop hematoma Explained that this will resolve spontaneously No concern for recurrent hydrocele or infectious process Advised to return in 1-2 months if does not resolve completely  2. Bilateral inguinal hernia S/p RIH repair   Return if symptoms worsen or fail to improve.   Hollice Espy, MD  [facility] [address], [place], [place] [address] [phone]

## 2015-04-21 ENCOUNTER — Encounter: Payer: Self-pay | Admitting: Internal Medicine

## 2015-05-28 ENCOUNTER — Other Ambulatory Visit: Payer: Self-pay | Admitting: Internal Medicine

## 2016-06-15 ENCOUNTER — Other Ambulatory Visit: Payer: Self-pay

## 2016-07-21 ENCOUNTER — Encounter: Payer: Self-pay | Admitting: Urology

## 2016-07-21 ENCOUNTER — Ambulatory Visit (INDEPENDENT_AMBULATORY_CARE_PROVIDER_SITE_OTHER): Payer: Medicare Other | Admitting: Urology

## 2016-07-21 VITALS — BP 168/78 | HR 70 | Ht 64.0 in | Wt 201.0 lb

## 2016-07-21 DIAGNOSIS — Z87438 Personal history of other diseases of male genital organs: Secondary | ICD-10-CM | POA: Diagnosis not present

## 2016-07-21 DIAGNOSIS — R972 Elevated prostate specific antigen [PSA]: Secondary | ICD-10-CM

## 2016-07-21 NOTE — Progress Notes (Signed)
07/21/2016 12:01 PM   Brian Beck [birth year] [id number]  Referring provider: Casilda Carls [address] [place], [place] [address]  Chief Complaint  Patient presents with  . Elevated PSA    HPI: 66 year old male previously known to our office for right hydrocele status post hydrocelectomy in 2016 returns today for evaluation of elevated PSA.  PSA 6.1 on 06/09/16.  This is up from his previous value of 4.3 on 04/20/2015.  He reports today that he has been told his PSA was elevated once the past.  He reports that the PSA was repeated at that time and can identify normal. He does not recall the exact values.  He is no family history prostate cancer.  Occasional weak stream.  Otherwise, he has no urinary symptoms including no dysuria, incomplete bladder emptying, nocturia, urgency, frequency, dysuria, or gross hematuria.  PMH: Past Medical History:  Diagnosis Date  . Arthritis   . Hydrocele, right   . Hyperlipemia   . Hypertension   . Hypothyroidism   . Melanoma of skin ([place])   . Pain in knee   . Pain in scrotum     Surgical History: Past Surgical History:  Procedure Laterality Date  . HYDROCELE EXCISION Right 03/16/2015   Procedure: HYDROCELECTOMY ADULT;  Surgeon: Hollice Espy, MD;  Location: [facility] ORS;  Service: Urology;  Laterality: Right;  . INGUINAL HERNIA REPAIR Right 03/16/2015   Procedure: HERNIA REPAIR INGUINAL ADULT WITH MESH;  Surgeon: Clayburn Pert, MD;  Location: [facility] OR[facility];  Service: General;  Laterality: Right;  . SKIN LESION EXCISION  2014  . TOTAL KNEE ARTHROPLASTY Right 1997    Home Medications:  Allergies as of 07/21/2016      Reactions   Penicillins Swelling      Medication List       Accurate as of 07/21/16 12:01 PM. Always use your most recent med list.          ascorbic acid 1000 MG tablet Commonly known as:  VITAMIN C Take 1,000 mg by mouth daily.   CENTRUM SILVER PO Take 1 tablet by mouth daily. Reported on 03/19/2015     docusate sodium 100 MG capsule Commonly known as:  COLACE Take 1 capsule (100 mg total) by mouth 2 (two) times daily.   Fish Oil 1000 MG Caps Take 1 capsule by mouth 1 day or 1 dose.   vitamin B-12 500 MCG tablet Commonly known as:  CYANOCOBALAMIN Take 500 mcg by mouth daily.       Allergies:  Allergies  Allergen Reactions  . Penicillins Swelling    Family History: Family History  Problem Relation Age of Onset  . Heart disease Father   . Kidney disease Mother   . Prostate cancer Neg Hx   . Bladder Cancer Neg Hx     Social History:  reports that he has been smoking Cigarettes.  He has been smoking about 0.75 packs per day. His smokeless tobacco use includes Snuff. He reports that he drinks alcohol. He reports that he does not use drugs.  ROS: UROLOGY Frequent Urination?: No Hard to postpone urination?: No Burning/pain with urination?: No Get up at night to urinate?: No Leakage of urine?: No Urine stream starts and stops?: No Trouble starting stream?: No Do you have to strain to urinate?: No Blood in urine?: No Urinary tract infection?: No Sexually transmitted disease?: No Injury to kidneys or bladder?: No Painful intercourse?: No Weak stream?: Yes Erection problems?: No Penile pain?: No  Gastrointestinal Nausea?: No  Vomiting?: No Indigestion/heartburn?: No Diarrhea?: No Constipation?: No  Constitutional Fever: No Night sweats?: No Weight loss?: No Fatigue?: No  Skin Skin rash/lesions?: No Itching?: No  Eyes Blurred vision?: Yes Double vision?: No  Ears/Nose/Throat Sore throat?: No Sinus problems?: No  Hematologic/Lymphatic Swollen glands?: No Easy bruising?: No  Cardiovascular Leg swelling?: Yes Chest pain?: No  Respiratory Cough?: No Shortness of breath?: No  Endocrine Excessive thirst?: No  Musculoskeletal Back pain?: Yes Joint pain?: Yes  Neurological Headaches?: No Dizziness?: No  Psychologic Depression?:  No Anxiety?: No  Physical Exam: BP (!) 168/78   Pulse 70   Ht 5\' 4"  (1.626 m)   Wt 201 lb (91.2 kg)   BMI 34.50 kg/m   Constitutional:  Alert and oriented, No acute distress. HEENT: [place] AT, moist mucus membranes.  Trachea midline, no masses. Cardiovascular: No clubbing, cyanosis, or edema. Respiratory: Normal respiratory effort, no increased work of breathing. GI: Abdomen is soft, nontender, nondistended, no abdominal masses GU: Circumcised phallus. Bilateral descended testicles, no masses. No persistent hydrocele or scrotal skin changes. Rectal: Normal sphincter tone. Enlarged 50 cc prostate, nontender, no masses or nodules. Skin: No rashes, bruises or suspicious lesions. Neurologic: Grossly intact, no focal deficits, moving all 4 extremities. Psychiatric: Normal mood and affect.  Laboratory Data: Lab Results  Component Value Date   WBC 8.2 03/04/2015   HGB 15.0 03/04/2015   HCT 42.9 03/04/2015   MCV 89.4 03/04/2015   PLT 230 03/04/2015    Lab Results  Component Value Date   CREATININE 0.78 03/04/2015    PSA as above.  Pertinent Imaging: n/a  Assessment & Plan:    1. Elevated PSA  We reviewed the implications of an elevated PSA and the uncertainty surrounding it. In general, a man's PSA increases with age and is produced by both normal and cancerous prostate tissue. Differential for elevated PSA is BPH, prostate cancer, infection, recent intercourse/ejaculation, prostate infarction, recent urethroscopic manipulation (foley placement/cystoscopy) and prostatitis. Management of an elevated PSA can include observation or prostate biopsy and wediscussed this in detail. We discussed that indications for prostate biopsy are defined by age and race specific PSA cutoffs as well as a PSA velocity of 0.75/year.  We will repeat PSA today to confirm its validity.  Based on these results, we will decide whether or not he has undergone prostate biopsy.   We did go ahead and discussed  this procedure at length today.  We discussed prostate biopsy in detail including the procedure itself, the risks of blood in the urine, stool, and ejaculate, serious infection, and discomfort. He is willing to proceed with this as discussed.  - PSA  2. History of hydrocele Status post hydrocelectomy No evidence of recurrence   Return for Will call with follow-up plan after repeat PSA.  Hollice Espy, MD  [facility] [address], [place], [place] [address] [phone]

## 2016-07-22 LAB — PSA: PROSTATE SPECIFIC AG, SERUM: 2.9 ng/mL (ref 0.0–4.0)

## 2016-07-24 ENCOUNTER — Telehealth: Payer: Self-pay

## 2016-07-24 NOTE — Telephone Encounter (Signed)
-----   Message from Hollice Espy, MD sent at 07/24/2016 10:31 AM EDT ----- Please let this patient know that his PSA is back down to 2.9 which is within the normal range for his age group. As such, no need for biopsies or any further intervention at this time. I would like to see him back in 1 year with a PSA just prior to that appointment.  Hollice Espy, MD

## 2016-07-24 NOTE — Telephone Encounter (Signed)
No answer

## 2016-07-25 NOTE — Telephone Encounter (Signed)
No answer

## 2016-07-26 NOTE — Telephone Encounter (Signed)
Spoke with pt in reference to PSA results. Made aware will need no further intervention and f/u in 1 year. Pt voiced understanding.

## 2016-08-30 IMAGING — CR DG CHEST 2V
2 series · 2 of 2 positions shown · non-contrast
Comparison: 07/09/2007

CLINICAL DATA: Preoperative evaluation for hernia surgery.

EXAM:
CHEST  2 VIEW

[chest pa]
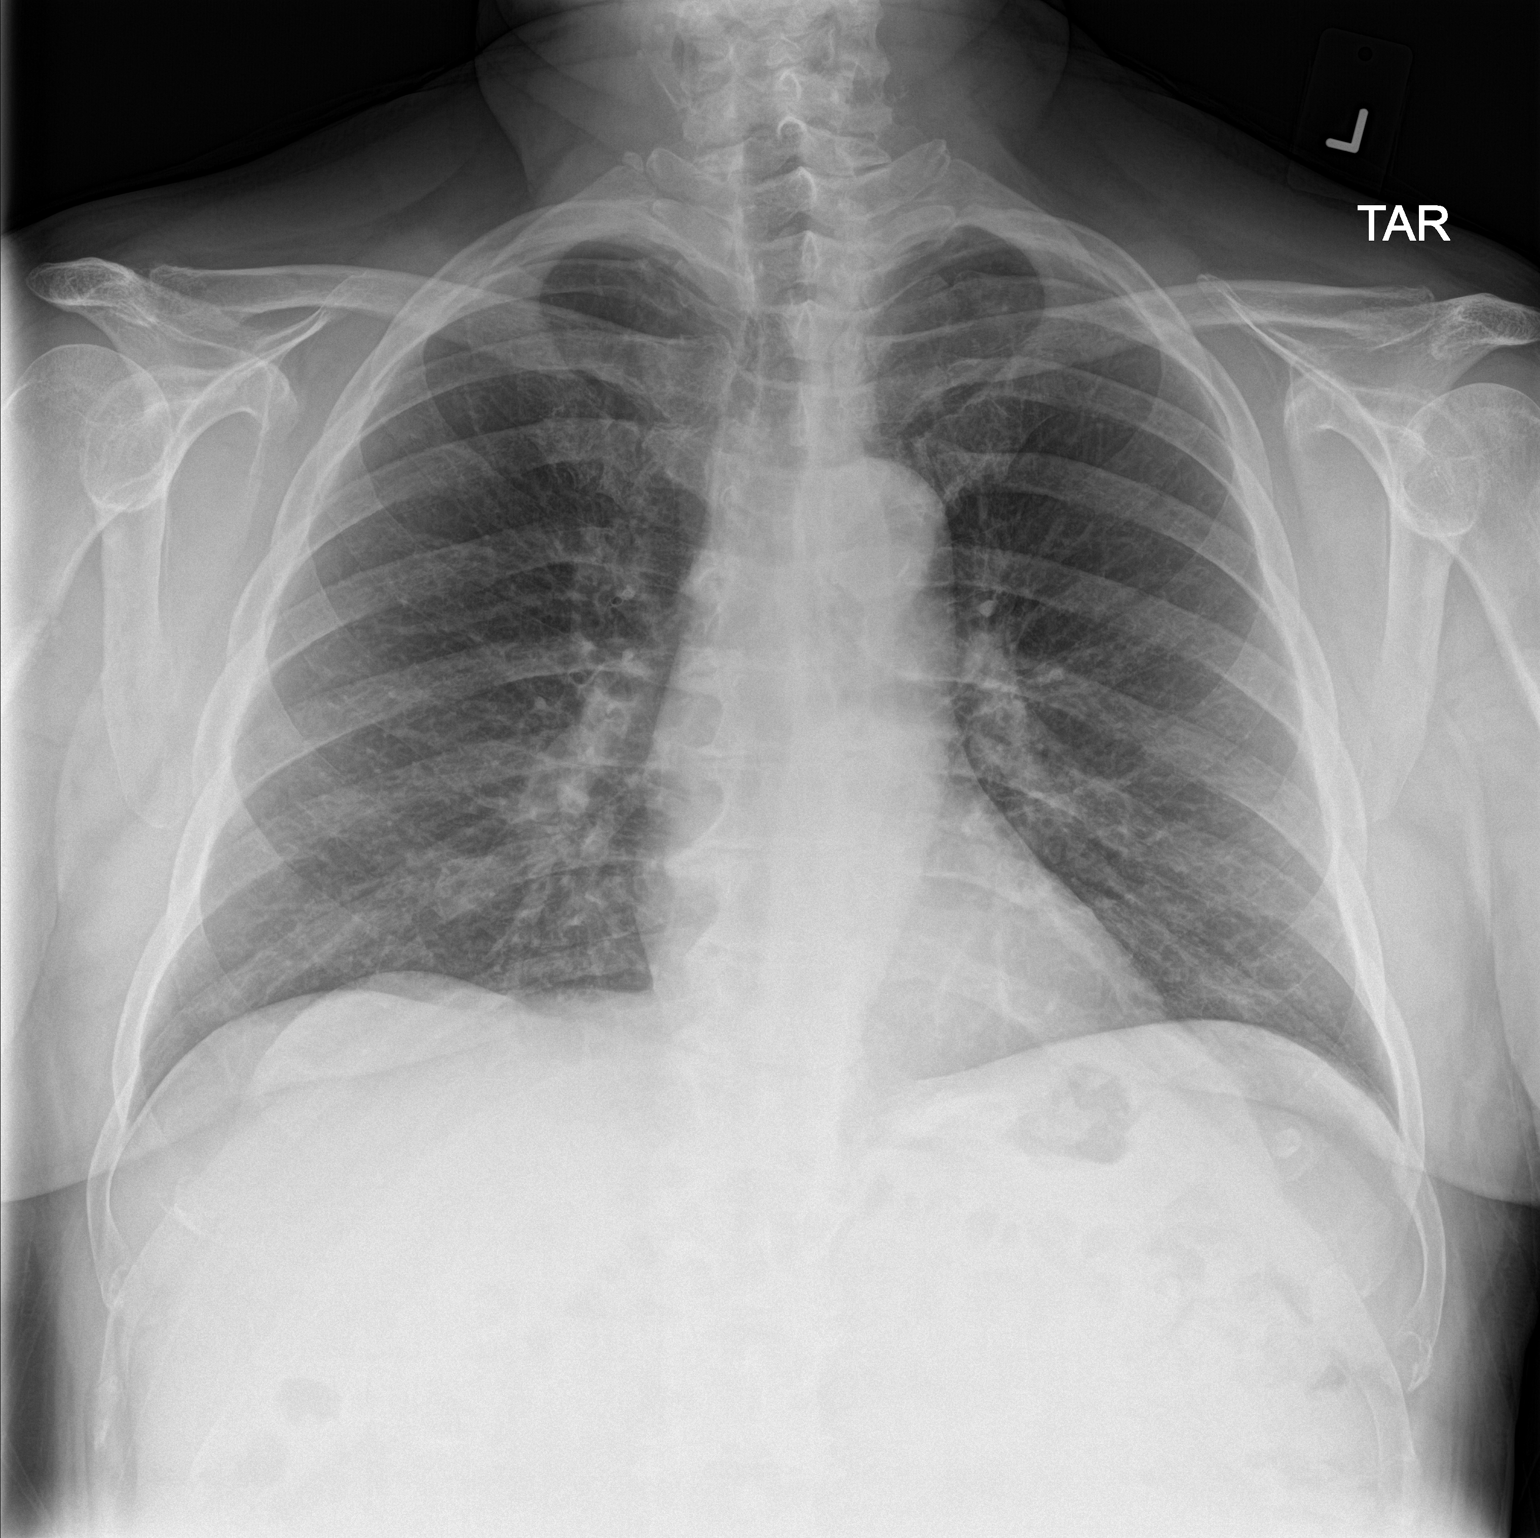

[chest lat]
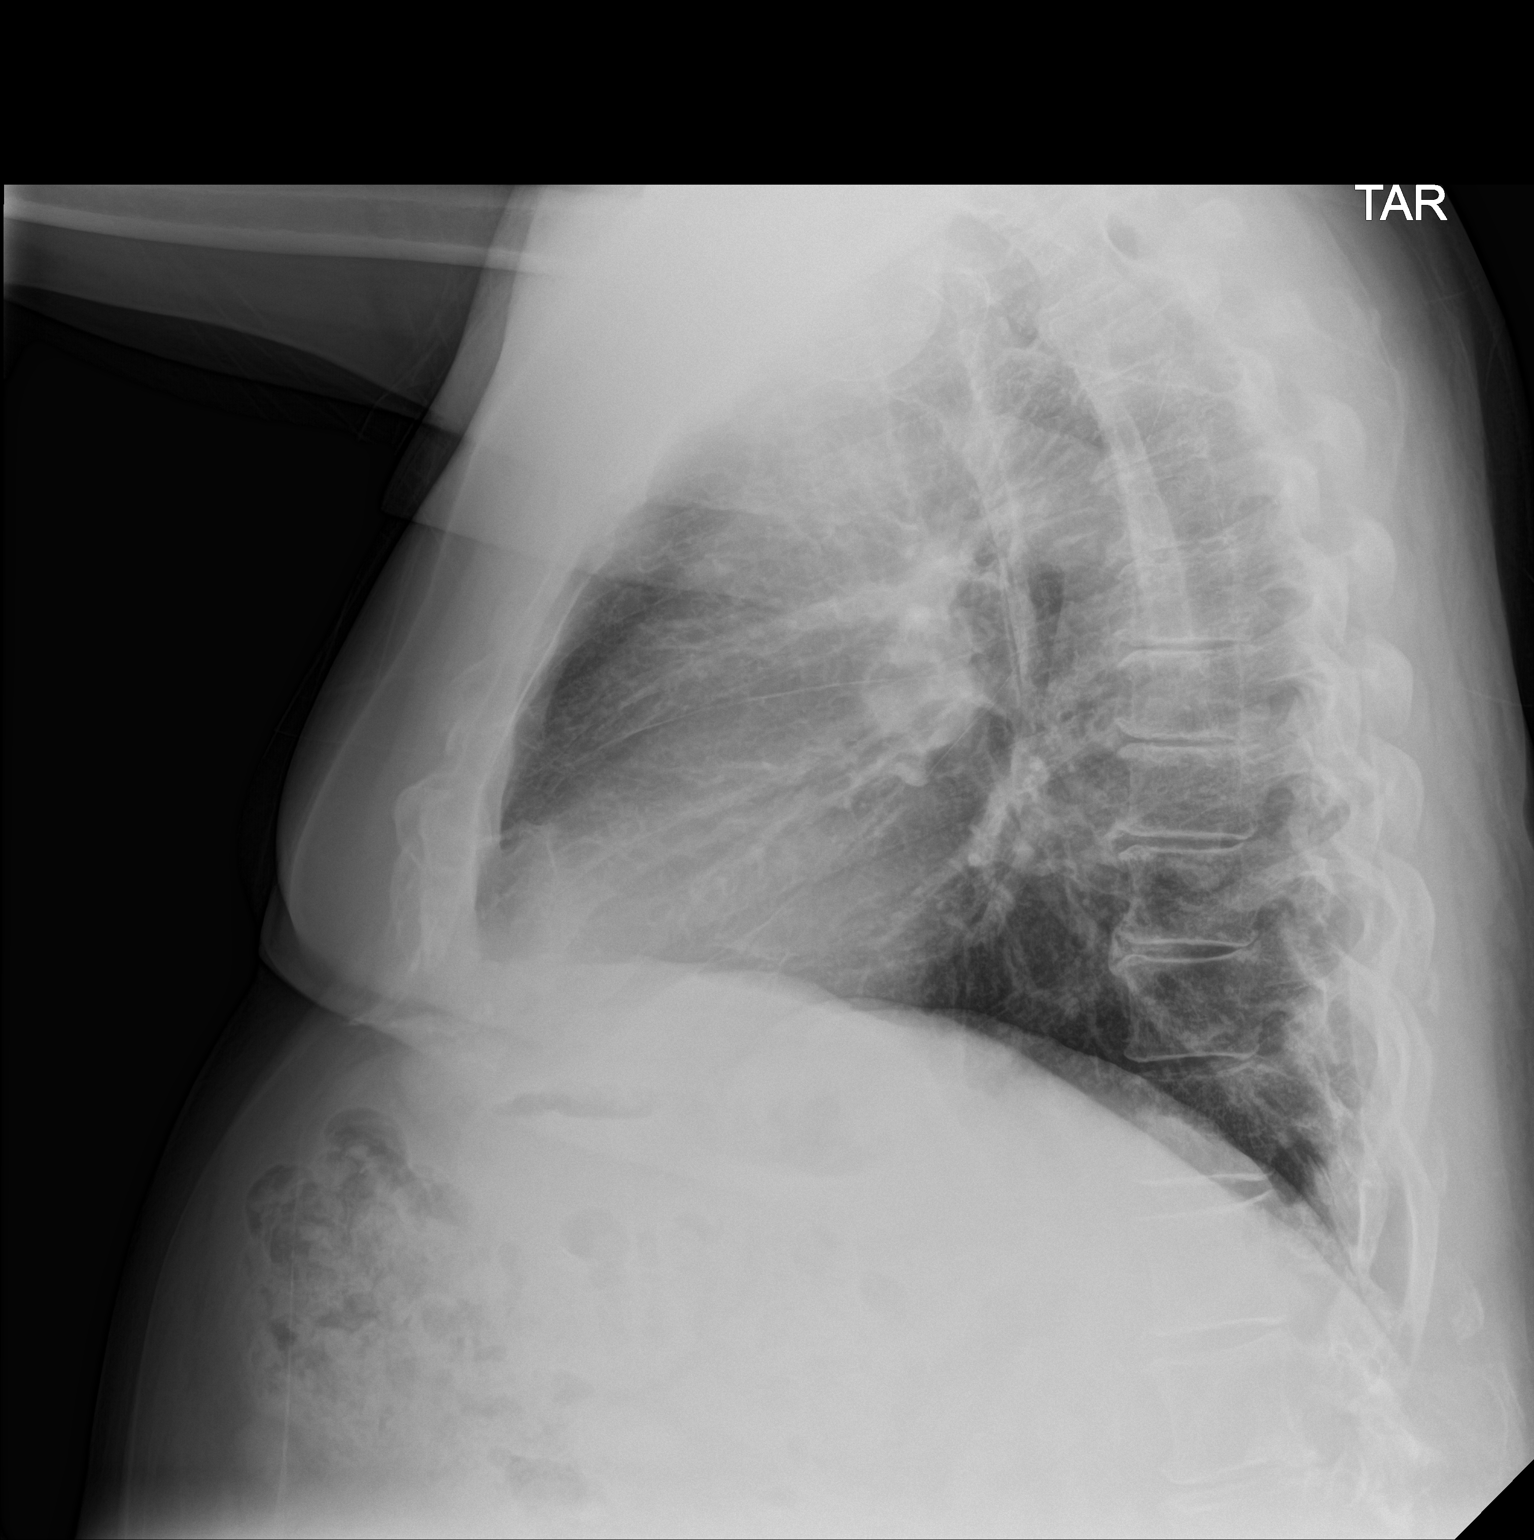

[2 of 2 positions shown; findings below may reference images not displayed]

FINDINGS: Normal heart size and vascularity. No focal pneumonia, collapse or
consolidation. No edema, effusion, or pneumothorax. Trachea midline.
Atherosclerosis of the aorta and degenerative changes of the spine.
Stable exam.
IMPRESSION: Stable chest exam.  No acute process.

Thoracic aortic atherosclerosis

## 2016-11-14 ENCOUNTER — Encounter: Payer: Self-pay | Admitting: *Deleted

## 2016-11-21 ENCOUNTER — Ambulatory Visit: Payer: Medicare Other | Admitting: Anesthesiology

## 2016-11-21 ENCOUNTER — Encounter
Admission: RE | Disposition: A | Discharge status: Home / Self Care | Payer: Self-pay | Source: Ambulatory Visit | Attending: Ophthalmology

## 2016-11-21 ENCOUNTER — Ambulatory Visit
Admission: RE | Admit: 2016-11-21 | Discharge: 2016-11-21 | Disposition: A | Discharge status: Home / Self Care | Payer: Medicare Other | Source: Ambulatory Visit | Attending: Ophthalmology | Admitting: Ophthalmology

## 2016-11-21 ENCOUNTER — Encounter: Payer: Self-pay | Admitting: *Deleted

## 2016-11-21 DIAGNOSIS — Z79899 Other long term (current) drug therapy: Secondary | ICD-10-CM | POA: Insufficient documentation

## 2016-11-21 DIAGNOSIS — H2511 Age-related nuclear cataract, right eye: Secondary | ICD-10-CM | POA: Insufficient documentation

## 2016-11-21 DIAGNOSIS — Z85828 Personal history of other malignant neoplasm of skin: Secondary | ICD-10-CM | POA: Diagnosis not present

## 2016-11-21 DIAGNOSIS — Z9889 Other specified postprocedural states: Secondary | ICD-10-CM | POA: Insufficient documentation

## 2016-11-21 DIAGNOSIS — M1990 Unspecified osteoarthritis, unspecified site: Secondary | ICD-10-CM | POA: Diagnosis not present

## 2016-11-21 DIAGNOSIS — Z87891 Personal history of nicotine dependence: Secondary | ICD-10-CM | POA: Diagnosis not present

## 2016-11-21 DIAGNOSIS — Z880 Allergy status to penicillin: Secondary | ICD-10-CM | POA: Diagnosis not present

## 2016-11-21 DIAGNOSIS — E039 Hypothyroidism, unspecified: Secondary | ICD-10-CM | POA: Insufficient documentation

## 2016-11-21 HISTORY — PX: CATARACT EXTRACTION W/PHACO: SHX586

## 2016-11-21 HISTORY — DX: Unspecified hearing loss, unspecified ear: H91.90

## 2016-11-21 SURGERY — PHACOEMULSIFICATION, CATARACT, WITH IOL INSERTION
Anesthesia: Monitor Anesthesia Care | Site: Eye | Laterality: Right | Wound class: Clean

## 2016-11-21 MED ORDER — NA CHONDROIT SULF-NA HYALURON 40-17 MG/ML IO SOLN
INTRAOCULAR | Status: DC | PRN
  Administered 2016-11-21: 1 mL via INTRAOCULAR

## 2016-11-21 MED ORDER — MIDAZOLAM HCL 2 MG/2ML IJ SOLN
INTRAMUSCULAR | Status: AC
  Filled 2016-11-21: qty 2

## 2016-11-21 MED ORDER — ARMC OPHTHALMIC DILATING DROPS
1.0000 "application " | OPHTHALMIC | Status: AC
  Administered 2016-11-21 (×3): 1 via OPHTHALMIC

## 2016-11-21 MED ORDER — MOXIFLOXACIN HCL 0.5 % OP SOLN
OPHTHALMIC | Status: AC
  Filled 2016-11-21: qty 3

## 2016-11-21 MED ORDER — MIDAZOLAM HCL 2 MG/2ML IJ SOLN
INTRAMUSCULAR | Status: DC | PRN
  Administered 2016-11-21: 2 mg via INTRAVENOUS

## 2016-11-21 MED ORDER — NA CHONDROIT SULF-NA HYALURON 40-17 MG/ML IO SOLN
INTRAOCULAR | Status: AC
  Filled 2016-11-21: qty 1

## 2016-11-21 MED ORDER — MOXIFLOXACIN HCL 0.5 % OP SOLN
OPHTHALMIC | Status: DC | PRN
  Administered 2016-11-21: 0.2 mL via OPHTHALMIC

## 2016-11-21 MED ORDER — EPINEPHRINE PF 1 MG/ML IJ SOLN
INTRAMUSCULAR | Status: DC | PRN
  Administered 2016-11-21: 13:00:00 via OPHTHALMIC

## 2016-11-21 MED ORDER — LIDOCAINE HCL (PF) 4 % IJ SOLN
INTRAMUSCULAR | Status: AC
  Filled 2016-11-21: qty 5

## 2016-11-21 MED ORDER — ARMC OPHTHALMIC DILATING DROPS
OPHTHALMIC | Status: AC
  Administered 2016-11-21: 1 via OPHTHALMIC
  Filled 2016-11-21: qty 0.4

## 2016-11-21 MED ORDER — SODIUM CHLORIDE 0.9 % IV SOLN
INTRAVENOUS | Status: DC
  Administered 2016-11-21: 12:00:00 via INTRAVENOUS

## 2016-11-21 MED ORDER — POVIDONE-IODINE 5 % OP SOLN
OPHTHALMIC | Status: DC | PRN
  Administered 2016-11-21: 1 via OPHTHALMIC

## 2016-11-21 MED ORDER — POVIDONE-IODINE 5 % OP SOLN
OPHTHALMIC | Status: AC
  Filled 2016-11-21: qty 30

## 2016-11-21 MED ORDER — CARBACHOL 0.01 % IO SOLN
INTRAOCULAR | Status: DC | PRN
  Administered 2016-11-21: 0.5 mL via INTRAOCULAR

## 2016-11-21 MED ORDER — EPINEPHRINE PF 1 MG/ML IJ SOLN
INTRAMUSCULAR | Status: AC
  Filled 2016-11-21: qty 2

## 2016-11-21 MED ORDER — MOXIFLOXACIN HCL 0.5 % OP SOLN: 1.0000 [drp] | OPHTHALMIC | Status: DC | PRN

## 2016-11-21 MED ORDER — LIDOCAINE HCL (PF) 4 % IJ SOLN
INTRAOCULAR | Status: DC | PRN
  Administered 2016-11-21: 4 mL via OPHTHALMIC

## 2016-11-21 SURGICAL SUPPLY — 16 items
GLOVE BIO SURGEON STRL SZ8 (GLOVE) ×2 IMPLANT
GLOVE BIOGEL M 6.5 STRL (GLOVE) ×2 IMPLANT
GLOVE SURG LX 8.0 MICRO (GLOVE) ×1
GLOVE SURG LX STRL 8.0 MICRO (GLOVE) ×1 IMPLANT
GOWN STRL REUS W/ TWL LRG LVL3 (GOWN DISPOSABLE) ×2 IMPLANT
GOWN STRL REUS W/TWL LRG LVL3 (GOWN DISPOSABLE) ×2
LABEL CATARACT MEDS ST (LABEL) ×2 IMPLANT
LENS IOL TECNIS ITEC 22.5 (Intraocular Lens) ×2 IMPLANT
PACK CATARACT (MISCELLANEOUS) ×2 IMPLANT
PACK CATARACT BRASINGTON LX (MISCELLANEOUS) ×2 IMPLANT
PACK EYE AFTER SURG (MISCELLANEOUS) ×2 IMPLANT
SOL BSS BAG (MISCELLANEOUS) ×2
SOLUTION BSS BAG (MISCELLANEOUS) ×1 IMPLANT
SYR 5ML LL (SYRINGE) ×2 IMPLANT
WATER STERILE IRR 250ML POUR (IV SOLUTION) ×2 IMPLANT
WIPE NON LINTING 3.25X3.25 (MISCELLANEOUS) ×2 IMPLANT

## 2016-11-21 NOTE — Anesthesia Postprocedure Evaluation (Signed)
Anesthesia Post Note  Patient: TAVITA EASTHAM  Procedure(s) Performed: Procedure(s) (LRB): CATARACT EXTRACTION PHACO AND INTRAOCULAR LENS PLACEMENT (IOC) (Right)  Patient location during evaluation: Short Stay Anesthesia Type: MAC Level of consciousness: awake and alert and oriented Pain management: pain level controlled Vital Signs Assessment: post-procedure vital signs reviewed and stable Respiratory status: spontaneous breathing and respiratory function stable Cardiovascular status: stable Postop Assessment: no headache and adequate PO intake Anesthetic complications: no     Last Vitals:  Vitals:   11/21/16 1300 11/21/16 1301  BP: (!) 133/55 (!) 133/55  Pulse: (!) 48 (!) 48  Resp: 16 18  Temp: 36.4 C   SpO2: 99% 97%    Last Pain:  Vitals:   11/21/16 1149  TempSrc: Oral  PainSc: 0-No pain                 Lanora Manis

## 2016-11-21 NOTE — H&P (Signed)
All labs reviewed. Abnormal studies sent to patients PCP when indicated.  Previous H&P reviewed, patient examined, there are NO CHANGES.  Eliazer Hemphill LOUIS9/4/201812:33 PM

## 2016-11-21 NOTE — Transfer of Care (Signed)
Immediate Anesthesia Transfer of Care Note  Patient: Brian Beck  Procedure(s) Performed: Procedure(s) with comments: CATARACT EXTRACTION PHACO AND INTRAOCULAR LENS PLACEMENT (IOC) (Right) - [place] 01:02.1 AP% 20.2 CDE 12.58 Fluid Pack lot # [id number]  Patient Location: PACU and Short Stay  Anesthesia Type:MAC  Level of Consciousness: awake, alert  and oriented  Airway & Oxygen Therapy: Patient Spontanous Breathing  Post-op Assessment: Report given to RN and Post -op Vital signs reviewed and stable  Post vital signs: Reviewed and stable  Last Vitals:  Vitals:   11/21/16 1300 11/21/16 1301  BP: (!) 133/55 (!) 133/55  Pulse: (!) 48 (!) 48  Resp: 16 18  Temp: 36.4 C   SpO2: 99% 97%    Last Pain:  Vitals:   11/21/16 1149  TempSrc: Oral  PainSc: 0-No pain         Complications: No apparent anesthesia complications

## 2016-11-21 NOTE — Anesthesia Preprocedure Evaluation (Signed)
Anesthesia Evaluation  Patient identified by MRN, date of birth, ID band Patient awake    Reviewed: Allergy & Precautions, NPO status , Patient's Chart, lab work & pertinent test results, reviewed documented beta blocker date and time   Airway Mallampati: III  TM Distance: >3 FB     Dental  (+) Chipped   Pulmonary former smoker,           Cardiovascular      Neuro/Psych    GI/Hepatic   Endo/Other  Hypothyroidism   Renal/GU      Musculoskeletal  (+) Arthritis ,   Abdominal   Peds  Hematology   Anesthesia Other Findings   Reproductive/Obstetrics                             Anesthesia Physical Anesthesia Plan  ASA: III  Anesthesia Plan: MAC   Post-op Pain Management:    Induction:   PONV Risk Score and Plan:   Airway Management Planned:   Additional Equipment:   Intra-op Plan:   Post-operative Plan:   Informed Consent: I have reviewed the patients History and Physical, chart, labs and discussed the procedure including the risks, benefits and alternatives for the proposed anesthesia with the patient or authorized representative who has indicated his/her understanding and acceptance.     Plan Discussed with: CRNA  Anesthesia Plan Comments:         Anesthesia Quick Evaluation

## 2016-11-21 NOTE — Discharge Instructions (Signed)

## 2016-11-21 NOTE — Op Note (Signed)
PREOPERATIVE DIAGNOSIS:  Nuclear sclerotic cataract of the right eye.   POSTOPERATIVE DIAGNOSIS:  NUCLEAR SCLEROTIC CATARACT RIGHT EYE   OPERATIVE PROCEDURE: Procedure(s): CATARACT EXTRACTION PHACO AND INTRAOCULAR LENS PLACEMENT (IOC)   SURGEON:  Birder Robson, MD.   ANESTHESIA:  Anesthesiologist: Gunnar Bulla, MD CRNA: Jonna Clark, CRNA  1.      Managed anesthesia care. 2.      0.21ml of Shugarcaine was instilled in the eye following the paracentesis.   COMPLICATIONS:  None.   TECHNIQUE:   Stop and chop   DESCRIPTION OF PROCEDURE:  The patient was examined and consented in the preoperative holding area where the aforementioned topical anesthesia was applied to the right eye and then brought back to the Operating Room where the right eye was prepped and draped in the usual sterile ophthalmic fashion and a lid speculum was placed. A paracentesis was created with the side port blade and the anterior chamber was filled with viscoelastic. A near clear corneal incision was performed with the steel keratome. A continuous curvilinear capsulorrhexis was performed with a cystotome followed by the capsulorrhexis forceps. Hydrodissection and hydrodelineation were carried out with BSS on a blunt cannula. The lens was removed in a stop and chop  technique and the remaining cortical material was removed with the irrigation-aspiration handpiece. The capsular bag was inflated with viscoelastic and the Technis ZCB00  lens was placed in the capsular bag without complication. The remaining viscoelastic was removed from the eye with the irrigation-aspiration handpiece. The wounds were hydrated. The anterior chamber was flushed with Miostat and the eye was inflated to physiologic pressure. 0.11ml of Vigamox was placed in the anterior chamber. The wounds were found to be water tight. The eye was dressed with Vigamox. The patient was given protective glasses to wear throughout the day and a shield with which  to sleep tonight. The patient was also given drops with which to begin a drop regimen today and will follow-up with me in one day.  Implant Name Type Inv. Item Serial No. Manufacturer Lot No. LRB No. Used  LENS IOL DIOP 22.5 - [id number] Intraocular Lens LENS IOL DIOP 22.5 [id number] AMO   Right 1   Procedure(s) with comments: CATARACT EXTRACTION PHACO AND INTRAOCULAR LENS PLACEMENT (IOC) (Right) - [place] 01:02.1 AP% 20.2 CDE 12.58 Fluid Pack lot # [id number]  Electronically signed: [place] 11/21/2016 12:59 PM

## 2016-11-21 NOTE — Anesthesia Post-op Follow-up Note (Signed)
Anesthesia QCDR form completed.        

## 2016-11-22 ENCOUNTER — Encounter: Payer: Self-pay | Admitting: Ophthalmology

## 2017-07-23 ENCOUNTER — Encounter: Payer: Self-pay | Admitting: *Deleted

## 2017-07-24 ENCOUNTER — Encounter
Admission: RE | Disposition: A | Discharge status: Home / Self Care | Payer: Self-pay | Source: Ambulatory Visit | Attending: Ophthalmology

## 2017-07-24 ENCOUNTER — Ambulatory Visit: Payer: Medicare Other | Admitting: Anesthesiology

## 2017-07-24 ENCOUNTER — Other Ambulatory Visit: Payer: Self-pay

## 2017-07-24 ENCOUNTER — Encounter: Payer: Self-pay | Admitting: Anesthesiology

## 2017-07-24 ENCOUNTER — Ambulatory Visit
Admission: RE | Admit: 2017-07-24 | Discharge: 2017-07-24 | Disposition: A | Discharge status: Home / Self Care | Payer: Medicare Other | Source: Ambulatory Visit | Attending: Ophthalmology | Admitting: Ophthalmology

## 2017-07-24 DIAGNOSIS — Z79899 Other long term (current) drug therapy: Secondary | ICD-10-CM | POA: Insufficient documentation

## 2017-07-24 DIAGNOSIS — E669 Obesity, unspecified: Secondary | ICD-10-CM | POA: Diagnosis not present

## 2017-07-24 DIAGNOSIS — H2512 Age-related nuclear cataract, left eye: Secondary | ICD-10-CM | POA: Diagnosis present

## 2017-07-24 DIAGNOSIS — Z85828 Personal history of other malignant neoplasm of skin: Secondary | ICD-10-CM | POA: Insufficient documentation

## 2017-07-24 DIAGNOSIS — Z6833 Body mass index (BMI) 33.0-33.9, adult: Secondary | ICD-10-CM | POA: Insufficient documentation

## 2017-07-24 DIAGNOSIS — Z880 Allergy status to penicillin: Secondary | ICD-10-CM | POA: Diagnosis not present

## 2017-07-24 DIAGNOSIS — M1990 Unspecified osteoarthritis, unspecified site: Secondary | ICD-10-CM | POA: Diagnosis not present

## 2017-07-24 DIAGNOSIS — Z87891 Personal history of nicotine dependence: Secondary | ICD-10-CM | POA: Diagnosis not present

## 2017-07-24 HISTORY — DX: Palpitations: R00.2

## 2017-07-24 HISTORY — PX: CATARACT EXTRACTION W/PHACO: SHX586

## 2017-07-24 HISTORY — DX: Edema, unspecified: R60.9

## 2017-07-24 SURGERY — PHACOEMULSIFICATION, CATARACT, WITH IOL INSERTION
Anesthesia: Monitor Anesthesia Care | Site: Eye | Laterality: Left | Wound class: "Clean "

## 2017-07-24 MED ORDER — MOXIFLOXACIN HCL 0.5 % OP SOLN
OPHTHALMIC | Status: AC
  Filled 2017-07-24: qty 3

## 2017-07-24 MED ORDER — MOXIFLOXACIN HCL 0.5 % OP SOLN
OPHTHALMIC | Status: DC | PRN
  Administered 2017-07-24: .2 mL via OPHTHALMIC

## 2017-07-24 MED ORDER — SODIUM CHLORIDE 0.9 % IV SOLN
INTRAVENOUS | Status: DC
  Administered 2017-07-24: 09:00:00 via INTRAVENOUS

## 2017-07-24 MED ORDER — EPINEPHRINE PF 1 MG/ML IJ SOLN
INTRAMUSCULAR | Status: AC
  Filled 2017-07-24: qty 1

## 2017-07-24 MED ORDER — NA CHONDROIT SULF-NA HYALURON 40-17 MG/ML IO SOLN
INTRAOCULAR | Status: AC
  Filled 2017-07-24: qty 1

## 2017-07-24 MED ORDER — MOXIFLOXACIN HCL 0.5 % OP SOLN: 1.0000 [drp] | OPHTHALMIC | Status: DC | PRN

## 2017-07-24 MED ORDER — CARBACHOL 0.01 % IO SOLN
INTRAOCULAR | Status: DC | PRN
  Administered 2017-07-24: .5 mL via INTRAOCULAR

## 2017-07-24 MED ORDER — LIDOCAINE HCL (PF) 4 % IJ SOLN
INTRAMUSCULAR | Status: AC
  Filled 2017-07-24: qty 5

## 2017-07-24 MED ORDER — MIDAZOLAM HCL 2 MG/2ML IJ SOLN
INTRAMUSCULAR | Status: DC | PRN
  Administered 2017-07-24: 1 mg via INTRAVENOUS

## 2017-07-24 MED ORDER — NA CHONDROIT SULF-NA HYALURON 40-17 MG/ML IO SOLN
INTRAOCULAR | Status: DC | PRN
  Administered 2017-07-24: 1 mL via INTRAOCULAR

## 2017-07-24 MED ORDER — EPINEPHRINE PF 1 MG/ML IJ SOLN
INTRAOCULAR | Status: DC | PRN
  Administered 2017-07-24: 1 mL via OPHTHALMIC

## 2017-07-24 MED ORDER — ARMC OPHTHALMIC DILATING DROPS
1.0000 "application " | OPHTHALMIC | Status: AC
  Administered 2017-07-24 (×3): 1 via OPHTHALMIC

## 2017-07-24 MED ORDER — POVIDONE-IODINE 5 % OP SOLN
OPHTHALMIC | Status: AC
Start: 2017-07-24 — End: ?
  Filled 2017-07-24: qty 30

## 2017-07-24 MED ORDER — ARMC OPHTHALMIC DILATING DROPS
OPHTHALMIC | Status: AC
  Filled 2017-07-24: qty 0.4

## 2017-07-24 MED ORDER — MIDAZOLAM HCL 2 MG/2ML IJ SOLN
INTRAMUSCULAR | Status: AC
  Filled 2017-07-24: qty 2

## 2017-07-24 MED ORDER — LIDOCAINE HCL (PF) 4 % IJ SOLN
INTRAOCULAR | Status: DC | PRN
  Administered 2017-07-24: 2 mL via OPHTHALMIC

## 2017-07-24 MED ORDER — POVIDONE-IODINE 5 % OP SOLN
OPHTHALMIC | Status: DC | PRN
  Administered 2017-07-24: 1 via OPHTHALMIC

## 2017-07-24 SURGICAL SUPPLY — 16 items
GLOVE BIO SURGEON STRL SZ8 (GLOVE) ×3 IMPLANT
GLOVE BIOGEL M 6.5 STRL (GLOVE) ×3 IMPLANT
GLOVE SURG LX 8.0 MICRO (GLOVE) ×2
GLOVE SURG LX STRL 8.0 MICRO (GLOVE) ×1 IMPLANT
GOWN STRL REUS W/ TWL LRG LVL3 (GOWN DISPOSABLE) ×2 IMPLANT
GOWN STRL REUS W/TWL LRG LVL3 (GOWN DISPOSABLE) ×4
LABEL CATARACT MEDS ST (LABEL) ×3 IMPLANT
LENS IOL TECNIS ITEC 22.5 (Intraocular Lens) ×2 IMPLANT
PACK CATARACT (MISCELLANEOUS) ×3 IMPLANT
PACK CATARACT BRASINGTON LX (MISCELLANEOUS) ×3 IMPLANT
PACK EYE AFTER SURG (MISCELLANEOUS) ×3 IMPLANT
SOL BSS BAG (MISCELLANEOUS) ×3
SOLUTION BSS BAG (MISCELLANEOUS) ×1 IMPLANT
SYR 5ML LL (SYRINGE) ×3 IMPLANT
WATER STERILE IRR 250ML POUR (IV SOLUTION) ×3 IMPLANT
WIPE NON LINTING 3.25X3.25 (MISCELLANEOUS) ×3 IMPLANT

## 2017-07-24 NOTE — Anesthesia Postprocedure Evaluation (Signed)
Anesthesia Post Note  Patient: Brian Beck  Procedure(s) Performed: CATARACT EXTRACTION PHACO AND INTRAOCULAR LENS PLACEMENT (IOC) (Left Eye)  Patient location during evaluation: PACU Anesthesia Type: MAC Level of consciousness: awake Pain management: pain level controlled Vital Signs Assessment: post-procedure vital signs reviewed and stable Respiratory status: spontaneous breathing Cardiovascular status: blood pressure returned to baseline Postop Assessment: no apparent nausea or vomiting Anesthetic complications: no     Last Vitals:  Vitals:   07/24/17 0902  BP: (!) 184/72  Pulse: (!) 56  Resp: 18  Temp: (!) 36.3 C  SpO2: 98%    Last Pain:  Vitals:   07/24/17 0902  TempSrc: Temporal  PainSc: 0-No pain                 Carver Fila

## 2017-07-24 NOTE — Anesthesia Preprocedure Evaluation (Signed)
Anesthesia Evaluation  Patient identified by MRN, date of birth, ID band Patient awake    Reviewed: Allergy & Precautions, NPO status , Patient's Chart, lab work & pertinent test results, reviewed documented beta blocker date and time   Airway Mallampati: III  TM Distance: >3 FB     Dental  (+) Chipped   Pulmonary former smoker,           Cardiovascular      Neuro/Psych    GI/Hepatic   Endo/Other  Hypothyroidism   Renal/GU      Musculoskeletal  (+) Arthritis ,   Abdominal   Peds  Hematology   Anesthesia Other Findings Obese.  Reproductive/Obstetrics                             Anesthesia Physical Anesthesia Plan  ASA: III  Anesthesia Plan: MAC   Post-op Pain Management:    Induction:   PONV Risk Score and Plan:   Airway Management Planned:   Additional Equipment:   Intra-op Plan:   Post-operative Plan:   Informed Consent: I have reviewed the patients History and Physical, chart, labs and discussed the procedure including the risks, benefits and alternatives for the proposed anesthesia with the patient or authorized representative who has indicated his/her understanding and acceptance.     Plan Discussed with: CRNA  Anesthesia Plan Comments:         Anesthesia Quick Evaluation

## 2017-07-24 NOTE — Anesthesia Post-op Follow-up Note (Signed)
Anesthesia QCDR form completed.        

## 2017-07-24 NOTE — Transfer of Care (Signed)
Immediate Anesthesia Transfer of Care Note  Patient: Brian Beck  Procedure(s) Performed: CATARACT EXTRACTION PHACO AND INTRAOCULAR LENS PLACEMENT (IOC) (Left Eye)  Patient Location: PACU  Anesthesia Type:MAC  Level of Consciousness: awake, alert  and oriented  Airway & Oxygen Therapy: Patient Spontanous Breathing  Post-op Assessment: Report given to RN and Post -op Vital signs reviewed and stable  Post vital signs: Reviewed and stable  Last Vitals:  Vitals Value Taken Time  BP    Temp    Pulse    Resp    SpO2      Last Pain:  Vitals:   07/24/17 0902  TempSrc: Temporal  PainSc: 0-No pain         Complications: No apparent anesthesia complications

## 2017-07-24 NOTE — H&P (Signed)
All labs reviewed. Abnormal studies sent to patients PCP when indicated.  Previous H&P reviewed, patient examined, there are NO CHANGES.  Brian Ammon Porfilio5/7/201910:09 AM

## 2017-07-24 NOTE — Discharge Instructions (Signed)
Eye Surgery Discharge Instructions  Expect mild scratchy sensation or mild soreness. DO NOT RUB YOUR EYE!  The day of surgery:  Minimal physical activity, but bed rest is not required  No reading, computer work, or close hand work  No bending, lifting, or straining.  May watch TV  For 24 hours:  No driving, legal decisions, or alcoholic beverages  Safety precautions  Eat anything you prefer: It is better to start with liquids, then soup then solid foods.  _____ Eye patch should be worn until postoperative exam tomorrow.  ____ Solar shield eyeglasses should be worn for comfort in the sunlight/patch while sleeping  Resume all regular medications including aspirin or Coumadin if these were discontinued prior to surgery. You may shower, bathe, shave, or wash your hair. Tylenol may be taken for mild discomfort.  Call your doctor if you experience significant pain, nausea, or vomiting, fever > 101 or other signs of infection. [phone] or [phone] Specific instructions:  Follow-up Information    Birder Robson, MD Follow up.   Specialty:  Ophthalmology Why:  May 8 at 8:10am Contact information: [address] [place] [address] [phone]

## 2017-07-24 NOTE — Op Note (Signed)
PREOPERATIVE DIAGNOSIS:  Nuclear sclerotic cataract of the left eye.   POSTOPERATIVE DIAGNOSIS:  Nuclear sclerotic cataract of the left eye.   OPERATIVE PROCEDURE: Procedure(s): CATARACT EXTRACTION PHACO AND INTRAOCULAR LENS PLACEMENT (IOC)   SURGEON:  Birder Robson, MD.   ANESTHESIA:  Anesthesiologist: Gunnar Bulla, MD CRNA: Hedda Slade, CRNA Student Nurse Anesthetist: Carver Fila, RN  1.      Managed anesthesia care. 2.     0.62ml of Shugarcaine was instilled following the paracentesis   COMPLICATIONS:  None.   TECHNIQUE:   Stop and chop   DESCRIPTION OF PROCEDURE:  The patient was examined and consented in the preoperative holding area where the aforementioned topical anesthesia was applied to the left eye and then brought back to the Operating Room where the left eye was prepped and draped in the usual sterile ophthalmic fashion and a lid speculum was placed. A paracentesis was created with the side port blade and the anterior chamber was filled with viscoelastic. A near clear corneal incision was performed with the steel keratome. A continuous curvilinear capsulorrhexis was performed with a cystotome followed by the capsulorrhexis forceps. Hydrodissection and hydrodelineation were carried out with BSS on a blunt cannula. The lens was removed in a stop and chop  technique and the remaining cortical material was removed with the irrigation-aspiration handpiece. The capsular bag was inflated with viscoelastic and the Technis ZCB00 lens was placed in the capsular bag without complication. The remaining viscoelastic was removed from the eye with the irrigation-aspiration handpiece. The wounds were hydrated. The anterior chamber was flushed with Miostat and the eye was inflated to physiologic pressure. 0.12ml Vigamox was placed in the anterior chamber. The wounds were found to be water tight. The eye was dressed with Vigamox. The patient was given protective glasses to wear throughout the  day and a shield with which to sleep tonight. The patient was also given drops with which to begin a drop regimen today and will follow-up with me in one day. Implant Name Type Inv. Item Serial No. Manufacturer Lot No. LRB No. Used  LENS IOL DIOP 22.5 - [id number] Intraocular Lens LENS IOL DIOP 22.5 [id number]  Left 1    Procedure(s) with comments: CATARACT EXTRACTION PHACO AND INTRAOCULAR LENS PLACEMENT (IOC) (Left) - [place] 00:32 AP% 12.3 CDE 4.03 Fluid pack lot # [id number]  Electronically signed: Birder Robson 07/24/2017 10:30 AM

## 2018-05-21 ENCOUNTER — Encounter: Payer: Self-pay | Admitting: *Deleted

## 2019-12-06 ENCOUNTER — Ambulatory Visit
Admission: EM | Admit: 2019-12-06 | Discharge: 2019-12-06 | Disposition: A | Discharge status: Home / Self Care | Payer: Medicare Other | Attending: Emergency Medicine | Admitting: Emergency Medicine

## 2019-12-06 ENCOUNTER — Other Ambulatory Visit: Payer: Self-pay

## 2019-12-06 DIAGNOSIS — Z1152 Encounter for screening for COVID-19: Secondary | ICD-10-CM

## 2019-12-06 NOTE — ED Triage Notes (Signed)
covid exposure ---- no symptoms  

## 2019-12-08 LAB — SARS-COV-2, NAA 2 DAY TAT

## 2019-12-08 LAB — NOVEL CORONAVIRUS, NAA: SARS-CoV-2, NAA: NOT DETECTED
